# Patient Record
Sex: Male | Born: 1990 | Race: White | Hispanic: No | Marital: Single | Smoking: Current every day smoker
Health system: Southern US, Community
[De-identification: ages and names within clinical notes are randomized; demographics above are authoritative.]

## PROBLEM LIST (undated history)

## (undated) DIAGNOSIS — E079 Disorder of thyroid, unspecified: Secondary | ICD-10-CM

## (undated) DIAGNOSIS — C8590 Non-Hodgkin lymphoma, unspecified, unspecified site: Secondary | ICD-10-CM

## (undated) DIAGNOSIS — F329 Major depressive disorder, single episode, unspecified: Secondary | ICD-10-CM

## (undated) DIAGNOSIS — F1010 Alcohol abuse, uncomplicated: Secondary | ICD-10-CM

## (undated) DIAGNOSIS — F32A Depression, unspecified: Secondary | ICD-10-CM

## (undated) HISTORY — PX: APPENDECTOMY: SHX54

---
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
## 2008-11-08 ENCOUNTER — Emergency Department (HOSPITAL_COMMUNITY): Admission: EM | Admit: 2008-11-08 | Discharge: 2008-11-08 | Payer: Self-pay | Admitting: Emergency Medicine

## 2009-08-13 ENCOUNTER — Emergency Department (HOSPITAL_COMMUNITY): Admission: EM | Admit: 2009-08-13 | Discharge: 2009-08-13 | Payer: Self-pay | Admitting: Emergency Medicine

## 2009-08-15 ENCOUNTER — Emergency Department (HOSPITAL_COMMUNITY): Admission: EM | Admit: 2009-08-15 | Discharge: 2009-08-16 | Payer: Self-pay | Admitting: Emergency Medicine

## 2009-08-16 ENCOUNTER — Inpatient Hospital Stay (HOSPITAL_COMMUNITY): Admission: RE | Admit: 2009-08-16 | Discharge: 2009-08-17 | Payer: Self-pay | Admitting: Psychiatry

## 2009-08-16 ENCOUNTER — Ambulatory Visit: Payer: Self-pay | Admitting: Psychiatry

## 2010-09-04 LAB — BASIC METABOLIC PANEL
BUN: 3 mg/dL — ABNORMAL LOW (ref 6–23)
BUN: 5 mg/dL — ABNORMAL LOW (ref 6–23)
CO2: 25 mEq/L (ref 19–32)
Calcium: 8.9 mg/dL (ref 8.4–10.5)
Calcium: 9.1 mg/dL (ref 8.4–10.5)
Chloride: 104 mEq/L (ref 96–112)
Creatinine, Ser: 0.67 mg/dL (ref 0.4–1.5)
GFR calc Af Amer: >60 mL/min (ref >60)
GFR calc non Af Amer: >60 mL/min (ref >60)
GFR calc non Af Amer: >60 mL/min (ref >60)
Glucose, Bld: 117 mg/dL — ABNORMAL HIGH (ref 70–99)
Glucose, Bld: 98 mg/dL (ref 70–99)
Potassium: 3.7 mEq/L (ref 3.5–5.1)
Potassium: 3.8 mEq/L (ref 3.5–5.1)
Sodium: 145 mEq/L (ref 135–145)

## 2010-09-04 LAB — RAPID URINE DRUG SCREEN, HOSP PERFORMED
Amphetamines: NOT DETECTED
Amphetamines: NOT DETECTED
Barbiturates: NOT DETECTED
Benzodiazepines: POSITIVE — AB
Benzodiazepines: POSITIVE — AB
Cocaine: NOT DETECTED
Cocaine: NOT DETECTED
Opiates: NOT DETECTED
Opiates: NOT DETECTED
Tetrahydrocannabinol: NOT DETECTED
Tetrahydrocannabinol: NOT DETECTED

## 2010-09-04 LAB — CBC
HCT: 47.2 % (ref 39.0–52.0)
HCT: 50.3 % (ref 39.0–52.0)
Hemoglobin: 17.8 g/dL — ABNORMAL HIGH (ref 13.0–17.0)
MCHC: 35.3 g/dL (ref 30.0–36.0)
MCV: 99.7 fL (ref 78.0–100.0)
Platelets: 180 10*3/uL (ref 150–400)
Platelets: 209 10*3/uL (ref 150–400)
RBC: 5.05 MIL/uL (ref 4.22–5.81)
RDW: 14.7 % (ref 11.5–15.5)
RDW: 15.1 % (ref 11.5–15.5)
WBC: 6.3 10*3/uL (ref 4.0–10.5)

## 2010-09-04 LAB — ETHANOL
Alcohol, Ethyl (B): 119 mg/dL — ABNORMAL HIGH (ref 0–10)
Alcohol, Ethyl (B): 244 mg/dL — ABNORMAL HIGH (ref 0–10)

## 2010-09-04 LAB — DIFFERENTIAL
Basophils Absolute: 0 10*3/uL (ref 0.0–0.1)
Basophils Absolute: 0 10*3/uL (ref 0.0–0.1)
Basophils Relative: 0 % (ref 0–1)
Eosinophils Absolute: 0 10*3/uL (ref 0.0–0.7)
Eosinophils Absolute: 0 10*3/uL (ref 0.0–0.7)
Eosinophils Relative: 0 % (ref 0–5)
Eosinophils Relative: 1 % (ref 0–5)
Lymphocytes Relative: 30 % (ref 12–46)
Lymphocytes Relative: 31 % (ref 12–46)
Lymphs Abs: 1.9 10*3/uL (ref 0.7–4.0)
Monocytes Absolute: 0.5 10*3/uL (ref 0.1–1.0)
Monocytes Relative: 7 % (ref 3–12)
Neutro Abs: 3.9 10*3/uL (ref 1.7–7.7)
Neutrophils Relative %: 62 % (ref 43–77)

## 2010-09-04 LAB — TSH: TSH: 4.785 u[IU]/mL — ABNORMAL HIGH (ref 0.350–4.500)

## 2010-09-04 LAB — HEPATIC FUNCTION PANEL
Albumin: 4.4 g/dL (ref 3.5–5.2)
Bilirubin, Direct: 0.3 mg/dL (ref 0.0–0.3)
Indirect Bilirubin: 1.3 mg/dL — ABNORMAL HIGH (ref 0.3–0.9)
Total Bilirubin: 1.6 mg/dL — ABNORMAL HIGH (ref 0.3–1.2)

## 2010-10-24 NOTE — Consult Note (Signed)
NAMETAKSH, HJORT NO.:  [id number]   MEDICAL RECORD NO.:  [id number]          PATIENT TYPE:  EMS   LOCATION:  ED                            FACILITY:  [facility]   PHYSICIAN:  J. Darreld Mclean, M.D. DATE OF BIRTH:  [birth year]   DATE OF CONSULTATION:  DATE OF DISCHARGE:  11/08/2008                                 CONSULTATION   The patient seen today in emergency room with request of the emergency  room staff.   The patient is a 20 year old white male, who last night cut his right  ulnar volar to most ulnarward forearm approximately 2 inches proximal to  the distal ulnar head.  It is a deep laceration.  It is approximately 4-  5 cm long and it is deep.  With Steri-Strips on it, he comes to the ER  today about 12 hours to 13 hours after the injury and wanted to be  checked.  He has no numbness.  He was concerned about the depth of the  wound and whether the Steri-Strips that he applied would be sufficient.  Denies any head injury.  Denies any other injuries.  Other than the  Steri-Strips, he had not done anything.  He did not put any type of  ointment or disinfectant over the wound.   Range of motion in the fingers are full on the right hand.  This is his  right arm.  Ulnar nerve function is intact.  The interosseous functions  are intact by taking a piece of paper and squeezing tightly.  The  interosseous function is normal.  There is no sensory loss.  Motor is  normal.   IMPRESSION:  Deep laceration, right forearm distally ulnar aspect volar  to ulnarward forearm.   Ms. Trisha Mangle, a physician assistant, will cleanse the wound.  She has  already started to do this and it was loosely reapproximated.  We will  place him on Keflex.  He does not want pain medicine and specifically  states rather just have Advil or Tylenol for pain, we will do that.  I  will see him in the office on Wednesday morning.  He is to call for an  appointment, numbers have been given.   Return to the emergency room if  any problem.   Please refer her note and the other nurse's notes in the chart here and  incorporated by reference.  His vitals are normal.           ______________________________  J. Darreld Mclean, M.D.     JWK/MEDQ  D:  11/08/2008  T:  11/09/2008  Job:  [id number]

## 2010-10-24 NOTE — H&P (Signed)
[name], [name] NO.:  [id number]   MEDICAL RECORD NO.:  [id number]          PATIENT TYPE:  IPS   LOCATION:  0301                          FACILITY:  [facility]   PHYSICIAN:  Jasmine Pang, M.D. DATE OF BIRTH:  [birth year]   DATE OF ADMISSION:  10/01/2007  DATE OF DISCHARGE:                       PSYCHIATRIC ADMISSION ASSESSMENT   HISTORY OF PRESENT ILLNESS:  The patient is here on petition.  Papers  state the patient was threatening to harm himself, feeling depressed.  He was assessed at [facility] for an overdose of Xanax and  alcohol.  The patient does today states that he was drinking some  because he has not been sleeping well.  He was up for least 24 hours.  Reporting his mind has been racing.  He was drinking [employer] and took  1 Xanax and found himself in the emergency room.  He states that he  drinks two to three times a week, drinks beer on the weekend, noted to  be at approximately 12-pack.  He denies any ongoing use of Xanax and  admits to buying them off the street.  He does not feel that he needs to  be here.  He denies that he was trying to harm himself.  He does not  feel depressed or suicidal.  He states he has been sleeping okay and is  scheduled to go to his family doctor to get some sleep aids.   PAST PSYCHIATRIC HISTORY:  First admission to [facility].  He does not currently see a psychiatrist or a Veterinary surgeon.   SOCIAL HISTORY:  An 20 year old male who lives his parents and a 13-year-  old sister.  Reports a good relationship with them and has a 10th grade  education.   FAMILY HISTORY:  Grandfather with alcohol problems .  The patient  smokes.  He reports his first drink was at age of 68, drinking Smirnov.  Denies any other drug use other than the recent use of Xanax.   PRIMARY CARE PHYSICIAN:  Dr. Wilnette Kales.   MEDICAL PROBLEMS:  1. A history of non-Hodgkin's lymphoma.  He reports __________ .  2. Hypothyroidism.   MEDICATIONS:  Has been on Synthroid 50 mcg.   DRUG ALLERGIES:  Reports stomach upset with HYDROCODONE.   PHYSICAL EXAMINATION:  GENERAL:  A young male, appears younger than his  calendar years, is in no acute distress.  He denies any complaints.  VITAL SIGNS:  Temperature 98.1, 69 heart rate, 16 respirations, blood  pressure is 109/70, 5 feet 8 inches tall, 115 pounds.  100% saturated.  His physical exam was reviewed at [facility].  No significant  findings.  He had poor eye contact.  Nursing skin  assessment showed he  had a scar from a Port-A-Cath.   LABORATORY DATA:  Urine drug screen is positive for benzodiazepines.  Sodium was 146.  Urinalysis is negative.  Alcohol level of 181 done on  January 24.  WBC is 5.  Liver enzymes are within normal limits.  Hemoglobin 13.1, hematocrit 37.   MENTAL STATUS EXAM:  This is a fully alert, cooperative male,  casually  dressed.  Speech is soft-spoken, polite.  Mood is neutral.  The  patient's affect is pleasant, flat.  Thought processes  are coherent.  No evidence of psychosis.  Cognitive function intact.  His memory is  good.  Judgment and insight is poor.   AXIS I:  Alcohol abuse, benzodiazepines abuse, rule out depressive  disorder NOS.  AXIS II:  Deferred.  AXIS III:  Hypothyroidism.  AXIS IV:  Education, psychosocial problems.  AXIS V:  Current is 45-50.   PLAN:  Plan is to have Librium available for withdrawal symptoms.  We  will have some trazodone available for sleep.  Will address underage  drinking.  Will have a family session with parents as soon as possible.  Case manager will assess follow-up.  Tentative length of stay is 2-3  days      Landry Corporal, N.P.      Jasmine Pang, M.D.  Electronically Signed    JO/MEDQ  D:  10/01/2007  T:  10/01/2007  Job:  [id number]

## 2010-10-27 NOTE — Discharge Summary (Signed)
[name], COLLEGE NO.:  [id number]   MEDICAL RECORD NO.:  [id number]          PATIENT TYPE:  IPS   LOCATION:  0301                          FACILITY[facility]  [facility]   PHYSICIAN:  Jasmine Pang, M.D. DATE OF BIRTH:  [birth year]   DATE OF ADMISSION:  10/01/2007  DATE OF DISCHARGE:  10/02/2007                               DISCHARGE SUMMARY   IDENTIFICATION:  This is an 20 year old male who was here on involuntary  commitment on October 01, 2007.   HISTORY OF PRESENT ILLNESS:  Papers stated that the patient was  threatening to harm himself feeling depressed.  He was assessed at  [facility] for an overdose of Xanax and alcohol.  The patient  does today state that he was drinking some, because he had not been  sleeping well.  He was up for at least 24 hours.  He reported that his  mind was racing.  He was drinking [employer] and took 1 Xanax and found  himself in the emergency room.  He states that he drinks beer on the  weekends and is noted to be at approximately 12 packs per day.  He  denies any ongoing use of Xanax and admits to buying them off the  street.  He does not feel he needs to be here.  He denies he was trying  to harm himself.  He does not feel depressed or suicidal.  He states  that he has been sleeping okay.   PSYCHIATRIC HISTORY:  This is the first admission to [facility].  He does not currently see a psychiatrist or counselor.   FAMILY HISTORY:  Grandfather had alcohol problems.   SUBSTANCE ABUSE HISTORY:  The patient smokes.  He reports his first  drink was at age 62 drinking Smirnoff.  He denies any other drug use  other than the recent use of Xanax.   MEDICAL PROBLEMS:  A history of non-Hodgkin lymphoma, also  hypothyroidism.   MEDICATIONS:  The patient has been on Synthroid 50 mcg daily.   DRUG ALLERGIES:  He reports stomach upset with HYDROCODONE.   PHYSICAL EXAM:  The patient presented as a young male who appeared  younger than his calendar years.  He had no acute medical or physical  problems.  He was evaluated at the ED prior to admission   LABORATORY DATA:  Urine drug screen was positive for benzodiazepine.  Sodium is 146.  Urinalysis is negative.  Alcohol level 181 done on  January 24 WBC is 5.  Liver enzymes are within normal limits.  Hemoglobin 13.1, hematocrit 37.   HOSPITAL COURSE:  Upon admission, the patient was continued on his  Synthroid 50 mcg p.o. daily.  He was also started on Librium 25 mg p.o.  q.6 h. p.r.n. symptoms of withdrawal and trazodone 50 mg p.o. q.h.s.  p.r.n.  He was also started on a nicotine patch 21 mg as per smoking  cessation protocol.  The patient tolerated these medications well with  no significant side effects.  In individual sessions with me, the  patient was reserved, but cooperative.  He denied he tried to kill  himself.  He stated he drinks beer on the weekends, but does not usually  get drunk.  He stated he had a Hodgkin lymphoma but cured now.  The  patient began to want discharge.  He stated he could go home and live  with his mother.  His mother was called and confirmed that this was  true.  She on October 02, 2007, the patient's mood was less depressed and  less anxious.  Affect was consistent with mood.  There was no suicidal  or homicidal ideation.  No thoughts of self-injurious behavior.  No  auditory or visual hallucinations.  No paranoia or delusions.  Thoughts  were logical and goal-directed.  Thought content, no predominant theme.  Cognitive was grossly back to baseline.  The patient felt ready to go  home and was going to be discharged after the family session.  There was  a family session with his mother.  The patient admitted he needs to stop  using alcohol.  Mother states she has banned alcohol from the house, but  the patient has knocked it in.  Mother is supportive of him trying to  quit.  The patient states he does not want medication or  counseling,  because he does not like pills and does not like talking about his  feelings.  Mother felt the patient would be better off if he had  something to do, so she is going to get him back in GED classes or get  him a job.  The patient denied suicidal ideation.  Mother denied any  concerns for his safety.   DISCHARGE DIAGNOSES:  Axis I:  Alcohol abuse or polysubstance abuse.  Depressive disorder, nothing otherwise specified.  Axis II:  None.  Axis III:  Hypothyroidism.  Axis IV:  Severe (problems with psychosocial issues, problems with  education, problems with finances, and burden to chemical dependence  illness and psychiatric illness).   Axis V:  Global assessment of functioning at discharge was 50.  GAF upon  admission was 45.  GAF highest past year was 60-65.   DISCHARGE PLAN:  There was no significant activity level or dietary  restrictions.   POSTHOSPITAL CARE PLANS:  The patient will go to the [facility] on April 29th at 10 a.m.   DISCHARGE MEDICATIONS:  Levothyroxine 10 mcg daily.      Jasmine Pang, M.D.  Electronically Signed     BHS/MEDQ  D:  11/02/2007  T:  11/03/2007  Job:  [id number]

## 2011-04-02 ENCOUNTER — Emergency Department (HOSPITAL_COMMUNITY)
Admission: EM | Admit: 2011-04-02 | Discharge: 2011-04-02 | Disposition: A | Discharge status: Other Acute Inpatient Hospital | Payer: Self-pay | Attending: Emergency Medicine | Admitting: Emergency Medicine

## 2011-04-02 ENCOUNTER — Encounter: Payer: Self-pay | Admitting: *Deleted

## 2011-04-02 ENCOUNTER — Inpatient Hospital Stay (HOSPITAL_COMMUNITY)
Admission: AD | Admit: 2011-04-02 | Discharge: 2011-04-06 | DRG: 897 | Disposition: A | Discharge status: Home / Self Care | Payer: 59 | Attending: Psychiatry | Admitting: Psychiatry

## 2011-04-02 DIAGNOSIS — F1020 Alcohol dependence, uncomplicated: Principal | ICD-10-CM

## 2011-04-02 DIAGNOSIS — Z87898 Personal history of other specified conditions: Secondary | ICD-10-CM

## 2011-04-02 DIAGNOSIS — F17200 Nicotine dependence, unspecified, uncomplicated: Secondary | ICD-10-CM | POA: Insufficient documentation

## 2011-04-02 DIAGNOSIS — E039 Hypothyroidism, unspecified: Secondary | ICD-10-CM

## 2011-04-02 DIAGNOSIS — R45851 Suicidal ideations: Secondary | ICD-10-CM

## 2011-04-02 DIAGNOSIS — Z79899 Other long term (current) drug therapy: Secondary | ICD-10-CM

## 2011-04-02 DIAGNOSIS — F3289 Other specified depressive episodes: Secondary | ICD-10-CM

## 2011-04-02 DIAGNOSIS — G4700 Insomnia, unspecified: Secondary | ICD-10-CM

## 2011-04-02 DIAGNOSIS — F329 Major depressive disorder, single episode, unspecified: Secondary | ICD-10-CM

## 2011-04-02 DIAGNOSIS — F1010 Alcohol abuse, uncomplicated: Secondary | ICD-10-CM

## 2011-04-02 DIAGNOSIS — F1994 Other psychoactive substance use, unspecified with psychoactive substance-induced mood disorder: Secondary | ICD-10-CM

## 2011-04-02 DIAGNOSIS — E079 Disorder of thyroid, unspecified: Secondary | ICD-10-CM | POA: Insufficient documentation

## 2011-04-02 HISTORY — DX: Depression, unspecified: F32.A

## 2011-04-02 HISTORY — DX: Major depressive disorder, single episode, unspecified: F32.9

## 2011-04-02 HISTORY — DX: Non-Hodgkin lymphoma, unspecified, unspecified site: C85.90

## 2011-04-02 HISTORY — DX: Disorder of thyroid, unspecified: E07.9

## 2011-04-02 LAB — ETHANOL: Alcohol, Ethyl (B): 148 mg/dL — ABNORMAL HIGH (ref 0–11)

## 2011-04-02 LAB — URINALYSIS, ROUTINE W REFLEX MICROSCOPIC
Ketones, ur: NEGATIVE mg/dL
Leukocytes, UA: NEGATIVE
Nitrite: NEGATIVE
Protein, ur: NEGATIVE mg/dL
Urobilinogen, UA: 0.2 mg/dL (ref 0.0–1.0)

## 2011-04-02 LAB — URINE MICROSCOPIC-ADD ON

## 2011-04-02 NOTE — ED Provider Notes (Signed)
History     CSN: [id number] Arrival date & time: 04/02/2011  9:20 AM   First MD Initiated Contact with Patient 04/02/11 [phone]      Chief Complaint  Patient presents with  . Medical Clearance    (Consider location/radiation/quality/duration/timing/severity/associated sxs/prior treatment) HPI Comments: Patient is brought in by [facility] at mothers request as he was found with lacerations on his scalp this morning which were self inflicted, as a straight edge blade was found in his home, with fresh blood on it.  Patient however,  Denies any thoughts or attempt to harm self.  He states he was intoxicated last night,  And tripped,  Hitting his head on a coffee table, breaking the glass.   States "if I wanted to hurt myself, I would have slit my wrists,  Not my forehead".    Apparently there were also pictures found at his home (and on facebook?) with blood on his face,  Holding a hatchet to his throat.  When asked about this, patient states this was a staged picture for Halloween,  The blood was fake,  And again he has no suicidal intent.  He also denies depression,  But states has been upset over his parents separation.     Per the sheriff at bedside,  There was no damage to the coffee table at the patients home when he was picked up.  All that was found was the bloody blade and lots of blood on patients clothing and immediate surroundings.  Patient is a 20 y.o. male presenting with mental health disorder. The history is provided by the patient.  Mental Health Problem The primary symptoms do not include dysphoric mood or hallucinations.  Additional symptoms of the illness do not include no headaches or no abdominal pain.    Past Medical History  Diagnosis Date  . Lymphoma   . Depression   . Thyroid disease     Past Surgical History  Procedure Date  . Appendectomy     History reviewed. No pertinent family history.  History  Substance Use Topics  . Smoking  status: Current Everyday Smoker  . Smokeless tobacco: Not on file  . Alcohol Use: Yes      Review of Systems  Constitutional: Negative for fever.  HENT: Negative for congestion, sore throat and neck pain.   Eyes: Negative.   Respiratory: Negative for chest tightness and shortness of breath.   Cardiovascular: Negative for chest pain.  Gastrointestinal: Negative for nausea and abdominal pain.  Genitourinary: Negative.   Musculoskeletal: Negative for joint swelling and arthralgias.  Skin: Negative.  Negative for rash and wound.  Neurological: Negative for dizziness, weakness, light-headedness, numbness and headaches.  Hematological: Negative.   Psychiatric/Behavioral: Negative.  Negative for suicidal ideas, hallucinations, confusion, self-injury and dysphoric mood. The patient is not nervous/anxious.     Allergies  Morphine and related  Home Medications   Current Outpatient Rx  Name Route Sig Dispense Refill  . ACETAMINOPHEN 325 MG PO TABS Oral Take 650 mg by mouth every 6 (six) hours as needed. For pain     . LEVOTHYROXINE SODIUM 75 MCG PO TABS Oral Take 75 mcg by mouth daily.        BP 137/87  Pulse 108  Temp(Src) 98.2 F (36.8 C) (Oral)  Resp 16  Ht 5\' 6"  (1.676 m)  Wt 120 lb (54.432 kg)  BMI 19.37 kg/m2  SpO2 100%  Physical Exam  Nursing note and vitals reviewed. Constitutional: He is oriented  to person, place, and time. He appears well-developed and well-nourished. No distress.       Smells of etoh,  But answers questions appropriately,  Does not appear intoxicated.  HENT:  Head: Normocephalic and atraumatic.  Eyes: Conjunctivae are normal.  Neck: Normal range of motion.  Cardiovascular: Normal rate, regular rhythm, normal heart sounds and intact distal pulses.   Pulmonary/Chest: Effort normal and breath sounds normal. He has no wheezes.  Abdominal: Soft. Bowel sounds are normal. There is no tenderness.  Musculoskeletal: Normal range of motion.  Neurological:  He is alert and oriented to person, place, and time.  Skin: Skin is warm and dry.  Psychiatric: He has a normal mood and affect.    ED Course  Procedures (including critical care time)  Labs Reviewed  URINALYSIS, ROUTINE W REFLEX MICROSCOPIC - Abnormal; Notable for the following:    Hgb urine dipstick TRACE (*)    All other components within normal limits  URINE RAPID DRUG SCREEN (HOSP PERFORMED) - Abnormal; Notable for the following:    Benzodiazepines POSITIVE (*)    All other components within normal limits  ETHANOL - Abnormal; Notable for the following:    Alcohol, Ethyl (B) 148 (*)    All other components within normal limits  URINE MICROSCOPIC-ADD ON   No results found.   No diagnosis found.   Originally planned for telepsych consult to sort out patients suicidality given conflicting information.  However,  Mother presented to ed,  And additional information given,  Including that patient stated to her yesterday that if he had a gun,  He would kill himself.  She also expresses concern over his etoh abuse.  She states he drinks constantly when he is not passed out from drinking.  Call to Frances Maywood with ACT team who will come and get him placed for inpatient psych tx and detox.    MDM  Suicidal ideation and gesture. Etoh abuse.        Candis Musa, PA 04/02/11 1737

## 2011-04-02 NOTE — ED Notes (Signed)
Mother sent home and would like for Tommy with ACT to call her concerning pt

## 2011-04-02 NOTE — ED Notes (Signed)
Mother Challen Spainhour cell [phone]

## 2011-04-02 NOTE — ED Notes (Signed)
Patient with blister noted to right elbow, patient wanted it opened, instructed pt not to pop the blister, will monitor

## 2011-04-02 NOTE — ED Notes (Signed)
Pt was found in a room with superficial cuts to his head and clothing soaked in blood. Mother states pt stated he wanted to harm himself yesterday. Deputy also states pictures were taken with pt holding hatchet up to his throat. Mother states pt was treated for lymphoma at age 20 and "has not been the same since".

## 2011-04-02 NOTE — ED Provider Notes (Signed)
History     CSN: [id number] Arrival date & time: 04/02/2011  9:20 AM   First MD Initiated Contact with Patient 04/02/11 [phone]      Chief Complaint  Patient presents with  . Medical Clearance    (Consider location/radiation/quality/duration/timing/severity/associated sxs/prior treatment) HPI:  Level 5 caveat;  Urgent need for intervention.  Pt found in room c cuts to head and bloody clothes.  ? Hatchet was held to his throat.  Has s ideation  Past Medical History  Diagnosis Date  . Lymphoma   . Depression   . Thyroid disease     Past Surgical History  Procedure Date  . Appendectomy     History reviewed. No pertinent family history.  History  Substance Use Topics  . Smoking status: Current Everyday Smoker  . Smokeless tobacco: Not on file  . Alcohol Use: Yes      Review of Systems  Unable to perform ROS: Psychiatric disorder    Allergies  Morphine and related  Home Medications   Current Outpatient Rx  Name Route Sig Dispense Refill  . ACETAMINOPHEN 325 MG PO TABS Oral Take 650 mg by mouth every 6 (six) hours as needed. For pain     . LEVOTHYROXINE SODIUM 75 MCG PO TABS Oral Take 75 mcg by mouth daily.        BP 141/89  Pulse 88  Temp(Src) 97.3 F (36.3 C) (Oral)  Resp 16  Ht 5\' 6"  (1.676 m)  Wt 120 lb (54.432 kg)  BMI 19.37 kg/m2  SpO2 94%  Physical Exam  Nursing note and vitals reviewed. Constitutional: He is oriented to person, place, and time. He appears well-developed and well-nourished.  HENT:  Head: Normocephalic and atraumatic.  Eyes: Conjunctivae and EOM are normal. Pupils are equal, round, and reactive to light.  Neck: Normal range of motion. Neck supple.  Cardiovascular: Normal rate and regular rhythm.   Pulmonary/Chest: Effort normal and breath sounds normal.  Abdominal: Soft. Bowel sounds are normal.  Musculoskeletal: Normal range of motion.  Neurological: He is alert and oriented to person, place, and time.  Skin: Skin is warm and  dry.  Psychiatric:       Flat affect; S ideation    ED Course  Procedures (including critical care time)  Labs Reviewed  URINALYSIS, ROUTINE W REFLEX MICROSCOPIC - Abnormal; Notable for the following:    Hgb urine dipstick TRACE (*)    All other components within normal limits  URINE RAPID DRUG SCREEN (HOSP PERFORMED) - Abnormal; Notable for the following:    Benzodiazepines POSITIVE (*)    All other components within normal limits  ETHANOL - Abnormal; Notable for the following:    Alcohol, Ethyl (B) 148 (*)    All other components within normal limits  URINE MICROSCOPIC-ADD ON   No results found.   1. Suicidal ideation   2. Alcohol abuse       MDM  Obvious psychiatric issues;  Consult [facility]; likely admit        Donnetta Hutching, MD 04/20/11 [phone]

## 2011-04-03 DIAGNOSIS — F332 Major depressive disorder, recurrent severe without psychotic features: Secondary | ICD-10-CM

## 2011-04-04 LAB — COMPREHENSIVE METABOLIC PANEL
ALT: 43 U/L (ref 0–53)
AST: 34 U/L (ref 0–37)
Alkaline Phosphatase: 76 U/L (ref 39–117)
CO2: 29 mEq/L (ref 19–32)
GFR calc Af Amer: >90 mL/min (ref >90)
GFR calc non Af Amer: >90 mL/min (ref >90)
Glucose, Bld: 83 mg/dL (ref 70–99)
Potassium: 4.2 mEq/L (ref 3.5–5.1)
Sodium: 138 mEq/L (ref 135–145)
Total Protein: 7 g/dL (ref 6.0–8.3)

## 2011-04-04 LAB — CBC
Hemoglobin: 13.9 g/dL (ref 13.0–17.0)
Platelets: 174 10*3/uL (ref 150–400)
RBC: 4.24 MIL/uL (ref 4.22–5.81)

## 2011-04-05 NOTE — Assessment & Plan Note (Signed)
[name], [name] NO.:  [id number]  MEDICAL RECORD NO.:  [id number]  LOCATION:  0307                          FACILITY:  [facility]  PHYSICIAN:  Orson Aloe, MD       DATE OF BIRTH:  [birth year]  DATE OF ADMISSION:  04/02/2011 DATE OF DISCHARGE:                      PSYCHIATRIC ADMISSION ASSESSMENT   This is a 20 year old Caucasian male from [place], [place] involuntarily admitted to Dr. Tilman Neat service.  REASON FOR ADMISSION:  The patient has a history of alcohol-related legal problems and he says that he has not had problems with his excessive alcohol use since September of last year.  He drank 3-6 beers on Saturday night, the 20th, and he took a misstep coming down some stairs, fell off a step about 2-1/2 feet off the ground, landed with his head hitting the corner of a glass table and broke the glass and cut his forehead in 3 places in 3 different directions.  The family took out commitment papers on him.  Basically the commitment papers read:  He told me he wanted help Sunday, the 21st.  He said he had so much anger in him that he wanted to die or hurt somebody.  He said that if he could get a gun, he would kill himself or find pills to overdose.  He is a very lonely person.  He drinks by himself until he passes out and then he will sleep with beer under him so no one takes it.  When he hits the floor the next day, he is drinking again.  I really do think he would hurt himself or someone else.  He did use a razor blade tonight when he cut his head.  He is a very kind-hearted person sober.  His loneliness is the reason for his drinking and drinking is the reason for what he is doing now.  He needs help.  The commitment papers read: The respondent has told his mother that if he had a gun, he would shoot himself or he would overdose.  He also told her he would lie about everything if she tried to commit him.  He had been drinking excessively.  He cannot  contract for safety.  And so he is brought here for evaluation.  PAST PSYCHIATRIC HISTORY:  Admits to one DUI and one 90-day jail sentence related to breaking and entering at [facility] because of his alcohol use.  SOCIAL HISTORY:  He has a GED acquired while incarcerated at [facility].  He never married and has no kids.  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  FAMILY HISTORY:  His father drank as a teenager but quit when the patient was born.  The patient was not aware of any other family members having substance abuse or mental illness issues.  ALCOHOL-RELATED HISTORY:  He began using caffeine at age 89, nicotine at age 32, alcohol at age 67, THC only 2 or 3 times at age 23.  He used Xanax to go to sleep with in his 80s.  He said he "did not like the effect of that."  He has never used any IV drugs or inhalants.  MEDICAL HISTORY:  He has no primary care provider.  He has a history of Hodgkin's lymphoma, low Synthroid on Synthroid replacement but refuses to take it because it "does not feel right."  He is status post appendectomy.  MEDICATIONS:  Synthroid which he refuses to take.  ALLERGIES:  Morphine which makes him itch real bad.  POSITIVE FINDINGS:  His urine drug screen was positive for benzos.  His blood alcohol level was 148.  His urinalysis had trace hemoglobin.  No other labs were done.  PHYSICAL EXAMINATION:  VITAL SIGNS:  His blood pressure was 141/89; his pulse was 88; temperature is 97.3. MENTAL STATUS:  He denies any suicidal or homicidal ideation at this time.  He has good eye contact.  He is able to focus adequately in group and one-to-one settings.  Argues profusely that he really does not have an alcohol problem.  He needs to go home because his family needs him there.  He has clear goal-directed thoughts of not admitting to any kind of alcohol problem at this  time.  He has natural conversational speech, volume, rate and tone.  He is oriented times 4.  Recent and remote memory intact.  His judgment is extremely limited.  When it comes to his assessment of his substance abuse problem, insight is limited. COGNITIVE:  Average to below average intelligence.  DIAGNOSES:  AXIS I:  Alcohol dependence and substance-induced mood disorder. AXIS II:  Deferred. AXIS III:  Hypothyroidism status post Hodgkin's lymphoma. Status post appendectomy. AXIS IV:  Moderate with psychosocial problems related to substance use. Current is 35.  PLAN:  Plan is detox, collaborate with family.  Refer to aftercare if appropriate.  Estimated length stay is 3-5 days.          ______________________________ Orson Aloe, MD     EW/MEDQ  D:  04/03/2011  T:  04/03/2011  Job:  [id number]  Electronically Signed by Orson Aloe  on 04/05/2011 12:58:08 PM

## 2011-04-06 NOTE — ED Provider Notes (Signed)
Medical screening examination/treatment/procedure(s) were performed by non-physician practitioner and as supervising physician I was immediately available for consultation/collaboration.   Layliana Devins L Amerie Beaumont, MD 04/06/11 0902 

## 2011-04-10 NOTE — Discharge Summary (Signed)
[name], MORRICAL NO.:  [id number]  MEDICAL RECORD NO.:  [id number]  LOCATION:  0307                          FACILITY:  [facility]  PHYSICIAN:  Orson Aloe, MD       DATE OF BIRTH:  [birth year]  DATE OF ADMISSION:  04/02/2011 DATE OF DISCHARGE:  04/06/2011                              DISCHARGE SUMMARY   This 20 year old Caucasian male from [place], [place], is involuntarily committed.  He has a history of alcohol-related legal problems.  He says he has not had problems with his extensive alcohol use since September of last year.  He drank 3-6 beers on Saturday night October 20.  He took a misstep coming down some stairs, fell off a step about 2-1/2 feet off the ground, landed with his head hitting the corner of a glass table and broke off a 6 inch lead off the edge of a glass table and cut his forehead in 3 places in 3 different directions.  The family took out commitment papers.  The commitment papers read "he told me he wanted help Sunday October 21; he said he had so much angry in him that he wanted to die or hurt somebody; he said if he could get a gun he would kill himself or find pills to overdose; he is very lonely person; he drinks by himself until he passes out and then he will sleep with beer under him so no one takes it; when he hits the floor the next day he is drinking again; I really do think he would hurt himself or someone else; he did use razor blade tonight to cut his head; he is a very kind hearted person sober; his loneliness is the reason for his drinking and his drinking is the reason for what he is doing now; he needs help." that was a statement from his mother.  Respondent had told his mother that if he had a gun he would shoot himself.  He also coached his mother to recant all of that.  POSITIVE FINDINGS:  Urine drug screen was positive for benzos.  His alcohol blood level was 148.  Urinalysis had trace hemoglobin and no other labs  were done.  DIAGNOSES:  AXIS I:  Alcohol dependence with substance induced mood disorder. AXIS II:  Deferred. AXIS III:  Hypothyroidism, status post Hodgkin's lymphoma, status post appendectomy. AXIS IV:  Moderate with psychosocial problems related to substance abuse. AXIS V:  35.  The plan was for detox, collaborate with family, referred aftercare if appropriate.  He was admitted and started on Librium detox, given trazodone 50 mg at bedtime and Synthroid 50 mcg a day.  He was given no loading dose. Thyroid profile was ordered.  He needed medical followup for his thyroid.  He as also given a nicotine patch.  He was allowed to sign in voluntarily.  CBC and a CMET were also ordered.  Robitussin and Claritin were ordered for his allergies and seemed to be helpful for him.  CONDITION ON DISCHARGE:  The patient denied any hallucinations, illusions, delusions, suicidal intent or homicidal intent.  He flatly denied any suicidal ideation.  He swore he had never been suicidal.  He only had just fallen and taken misstep and hurt his head.  When he was confronted about his actual blading where he takes a razor blade and cuts his forehead and makes it bleed like all get out he states he was doing it for a Halloween picture prank.  He had good eye contact.  Able to focus well enough in one-to-one and group settings.  He had clear goal-directed thoughts and natural conversational speech, volume, rate and tone.  He is oriented times 4.  Recent and memory was intact. Judgment is not quite appropriate; he does not quite appreciate the disease concept of addiction.  His insight seems to be quite limited.  DISCHARGE DIAGNOSES:  AXIS I:  Alcohol dependence. AXIS II:  Deferred. AXIS III:  History of Hodgkin's lymphoma, hypothyroidism and status post appendectomy. AXIS IV:  Moderate, psychosocial issues related to substance use.  His GAF was 50. AXIS V:  50.  He is not on multiple antipsychotics  at time of discharge.  RECOMMENDATION:  Was to follow through with his taking his Synthroid and his allergy medicines and his sleeping medicines that specifically were levothyroxine 50 mcg a day, 10 mg of loratadine for allergies a day and trazodone 50 mg at bedtime for insomnia a day.  He was committed on outpatient commitment to follow up at [facility] at [phone] and had an appointment on October 30 at 8:00 a.m.  He was to ask for IOP and he was also to follow up at [facility] in [place] for his thyroid condition.  Further comments was he was to do 90 meetings in 90 days, get obsessed with his recovery, get a trusted sponsor and work steps honestly, call back for results of thyroid tests which are supposed to be available later.          ______________________________ Orson Aloe, MD     EW/MEDQ  D:  04/10/2011  T:  04/10/2011  Job:  [id number]  Electronically Signed by Orson Aloe  on 04/10/2011 01:48:35 PM

## 2011-11-11 ENCOUNTER — Emergency Department (HOSPITAL_COMMUNITY)
Admission: EM | Admit: 2011-11-11 | Discharge: 2011-11-11 | Disposition: A | Discharge status: Home / Self Care | Payer: Self-pay | Attending: Emergency Medicine | Admitting: Emergency Medicine

## 2011-11-11 ENCOUNTER — Encounter (HOSPITAL_COMMUNITY): Payer: Self-pay

## 2011-11-11 DIAGNOSIS — E079 Disorder of thyroid, unspecified: Secondary | ICD-10-CM | POA: Insufficient documentation

## 2011-11-11 DIAGNOSIS — F1010 Alcohol abuse, uncomplicated: Secondary | ICD-10-CM | POA: Insufficient documentation

## 2011-11-11 DIAGNOSIS — F10929 Alcohol use, unspecified with intoxication, unspecified: Secondary | ICD-10-CM

## 2011-11-11 DIAGNOSIS — F3289 Other specified depressive episodes: Secondary | ICD-10-CM | POA: Insufficient documentation

## 2011-11-11 DIAGNOSIS — F329 Major depressive disorder, single episode, unspecified: Secondary | ICD-10-CM | POA: Insufficient documentation

## 2011-11-11 HISTORY — DX: Alcohol abuse, uncomplicated: F10.10

## 2011-11-11 LAB — CBC
HCT: 42.4 % (ref 39.0–52.0)
Hemoglobin: 15.3 g/dL (ref 13.0–17.0)
MCH: 34.5 pg — ABNORMAL HIGH (ref 26.0–34.0)
RBC: 4.43 MIL/uL (ref 4.22–5.81)

## 2011-11-11 LAB — RAPID URINE DRUG SCREEN, HOSP PERFORMED
Cocaine: NOT DETECTED
Opiates: NOT DETECTED
Tetrahydrocannabinol: NOT DETECTED

## 2011-11-11 LAB — BASIC METABOLIC PANEL
BUN: 5 mg/dL — ABNORMAL LOW (ref 6–23)
CO2: 22 mEq/L (ref 19–32)
Calcium: 8.8 mg/dL (ref 8.4–10.5)
Glucose, Bld: 158 mg/dL — ABNORMAL HIGH (ref 70–99)
Potassium: 3.5 mEq/L (ref 3.5–5.1)
Sodium: 138 mEq/L (ref 135–145)

## 2011-11-11 LAB — SALICYLATE LEVEL: Salicylate Lvl: <2 mg/dL — ABNORMAL LOW (ref 2.8–20.0)

## 2011-11-11 LAB — DIFFERENTIAL
Eosinophils Absolute: 0.1 10*3/uL (ref 0.0–0.7)
Lymphs Abs: 1.5 10*3/uL (ref 0.7–4.0)
Monocytes Absolute: 0.4 10*3/uL (ref 0.1–1.0)
Monocytes Relative: 7 % (ref 3–12)
Neutrophils Relative %: 68 % (ref 43–77)

## 2011-11-11 LAB — URINALYSIS, ROUTINE W REFLEX MICROSCOPIC
Glucose, UA: NEGATIVE mg/dL
Hgb urine dipstick: NEGATIVE
Specific Gravity, Urine: <1.005 — ABNORMAL LOW (ref 1.005–1.030)

## 2011-11-11 LAB — ACETAMINOPHEN LEVEL: Acetaminophen (Tylenol), Serum: <15 ug/mL (ref 10–30)

## 2011-11-11 NOTE — Discharge Instructions (Signed)
You can get treatment for alcohol abuse at [facility].

## 2011-11-11 NOTE — ED Provider Notes (Signed)
History   This chart was scribed for Osvaldo Human, MD by Toya Smothers. The patient was seen in room APA16A/APA16A. Patient's care was started at 1150.  CSN: [id number]  Arrival date & time 11/11/11  1150   First MD Initiated Contact with Patient 11/11/11 1401      Chief Complaint  Patient presents with  . V70.1   HPI  [name] is a 21 y.o. male presents to the Emergency Department complaining of no illness. Pt states that his mother committed him this morning stating that he was suicidal, and that she was upset with his drinking habits, further stating that he non-suicidal. Pt list current home medications as Tylenol and Synthroid Levothroid and has a h/o lymphoma, depression, thyroid disease, and alcohol abuse. Pt is a  current everyday smoker admitting to the use of alcohol, denying the use of illicit drugs.  Pt list Cancer treatment [facility]   Past Medical History  Diagnosis Date  . Lymphoma   . Depression   . Thyroid disease   . Alcohol abuse    Past Surgical History  Procedure Date  . Appendectomy    History reviewed. No pertinent family history.  History  Substance Use Topics  . Smoking status: Current Everyday Smoker  . Smokeless tobacco: Not on file  . Alcohol Use: Yes    Review of Systems  Constitutional: Negative for fever and chills.  HENT: Negative for rhinorrhea and neck pain.   Eyes: Negative for pain.  Respiratory: Negative for cough and shortness of breath.   Cardiovascular: Negative for chest pain.  Gastrointestinal: Negative for nausea, vomiting, abdominal pain and diarrhea.  Genitourinary: Negative for dysuria.  Musculoskeletal: Negative for back pain.  Skin: Negative for rash.  Neurological: Negative for dizziness and weakness.  A complete 10 system review of systems was obtained and all systems are negative except as noted in the HPI and PMH.   Allergies  Morphine and related  Home Medications   Current Outpatient Rx  Name  Route Sig Dispense Refill  . ACETAMINOPHEN 325 MG PO TABS Oral Take 650 mg by mouth every 6 (six) hours as needed. For pain     . LEVOTHYROXINE SODIUM 75 MCG PO TABS Oral Take 75 mcg by mouth daily.       BP 139/80  Pulse 125  Temp(Src) 98.5 F (36.9 C) (Oral)  Resp 19  SpO2 95%  Physical Exam  Nursing note and vitals reviewed. Constitutional: He is oriented to person, place, and time. He appears well-developed and well-nourished. No distress.  HENT:  Head: Normocephalic and atraumatic.  Eyes: EOM are normal. Pupils are equal, round, and reactive to light.  Neck: Neck supple. No tracheal deviation present.  Cardiovascular: Normal rate.   Pulmonary/Chest: Effort normal. No respiratory distress.  Abdominal: Soft. He exhibits no distension.  Musculoskeletal: Normal range of motion. He exhibits no edema.  Neurological: He is alert and oriented to person, place, and time. No sensory deficit.  Skin: Skin is warm and dry.  Psychiatric: He has a normal mood and affect. His behavior is normal.    ED Course  Procedures (including critical care time)  DIAGNOSTIC STUDIES: Oxygen Saturation is 95% on room air, normal by my interpretation.    COORDINATION OF CARE: 14:10- Evaluated state of Pt's present illness  Labs Reviewed  CBC - Abnormal; Notable for the following:    MCH 34.5 (*)    MCHC 36.1 (*)    All other components within normal limits  ETHANOL - Abnormal; Notable for the following:    Alcohol, Ethyl (B) 309 (*)    All other components within normal limits  BASIC METABOLIC PANEL - Abnormal; Notable for the following:    Glucose, Bld 158 (*)    BUN 5 (*)    All other components within normal limits  URINALYSIS, ROUTINE W REFLEX MICROSCOPIC - Abnormal; Notable for the following:    Specific Gravity, Urine <1.005 (*)    All other components within normal limits  SALICYLATE LEVEL - Abnormal; Notable for the following:    Salicylate Lvl <2.0 (*)    All other components  within normal limits  URINE RAPID DRUG SCREEN (HOSP PERFORMED)  DIFFERENTIAL  ACETAMINOPHEN LEVEL   3:47 PM Results for orders placed during the hospital encounter of 11/11/11  URINE RAPID DRUG SCREEN (HOSP PERFORMED)      Component Value Range   Opiates NONE DETECTED  NONE DETECTED    Cocaine NONE DETECTED  NONE DETECTED    Benzodiazepines NONE DETECTED  NONE DETECTED    Amphetamines NONE DETECTED  NONE DETECTED    Tetrahydrocannabinol NONE DETECTED  NONE DETECTED    Barbiturates NONE DETECTED  NONE DETECTED   CBC      Component Value Range   WBC 6.4  4.0 - 10.5 (K/uL)   RBC 4.43  4.22 - 5.81 (MIL/uL)   Hemoglobin 15.3  13.0 - 17.0 (g/dL)   HCT 40.9  81.1 - 91.4 (%)   MCV 95.7  78.0 - 100.0 (fL)   MCH 34.5 (*) 26.0 - 34.0 (pg)   MCHC 36.1 (*) 30.0 - 36.0 (g/dL)   RDW 78.2  95.6 - 21.3 (%)   Platelets 181  150 - 400 (K/uL)  DIFFERENTIAL      Component Value Range   Neutrophils Relative 68  43 - 77 (%)   Neutro Abs 4.4  1.7 - 7.7 (K/uL)   Lymphocytes Relative 23  12 - 46 (%)   Lymphs Abs 1.5  0.7 - 4.0 (K/uL)   Monocytes Relative 7  3 - 12 (%)   Monocytes Absolute 0.4  0.1 - 1.0 (K/uL)   Eosinophils Relative 2  0 - 5 (%)   Eosinophils Absolute 0.1  0.0 - 0.7 (K/uL)   Basophils Relative 1  0 - 1 (%)   Basophils Absolute 0.0  0.0 - 0.1 (K/uL)  ETHANOL      Component Value Range   Alcohol, Ethyl (B) 309 (*) 0 - 11 (mg/dL)  BASIC METABOLIC PANEL      Component Value Range   Sodium 138  135 - 145 (mEq/L)   Potassium 3.5  3.5 - 5.1 (mEq/L)   Chloride 98  96 - 112 (mEq/L)   CO2 22  19 - 32 (mEq/L)   Glucose, Bld 158 (*) 70 - 99 (mg/dL)   BUN 5 (*) 6 - 23 (mg/dL)   Creatinine, Ser 0.86  0.50 - 1.35 (mg/dL)   Calcium 8.8  8.4 - 57.8 (mg/dL)   GFR calc non Af Amer >90  >90 (mL/min)   GFR calc Af Amer >90  >90 (mL/min)  URINALYSIS, ROUTINE W REFLEX MICROSCOPIC      Component Value Range   Color, Urine YELLOW  YELLOW    APPearance CLEAR  CLEAR    Specific Gravity, Urine  <1.005 (*) 1.005 - 1.030    pH 6.0  5.0 - 8.0    Glucose, UA NEGATIVE  NEGATIVE (mg/dL)   Hgb urine dipstick NEGATIVE  NEGATIVE    Bilirubin Urine NEGATIVE  NEGATIVE    Ketones, ur NEGATIVE  NEGATIVE (mg/dL)   Protein, ur NEGATIVE  NEGATIVE (mg/dL)   Urobilinogen, UA 0.2  0.0 - 1.0 (mg/dL)   Nitrite NEGATIVE  NEGATIVE    Leukocytes, UA NEGATIVE  NEGATIVE   ACETAMINOPHEN LEVEL      Component Value Range   Acetaminophen (Tylenol), Serum <15.0  10 - 30 (ug/mL)  SALICYLATE LEVEL      Component Value Range   Salicylate Lvl <2.0 (*) 2.8 - 20.0 (mg/dL)   Lab tests showed alcohol intoxicaton.  Pt seen by Hattie Perch, Behavioral Health counselor.  Mrs. Hyacinth Meeker called pt's mother, who told her that she had had patient committed to see if he was bipolar.  No indication for admission to a psychiatric facility.  Released.  Advised to stop drinking.     1. Alcohol intoxication     I personally performed the services described in this documentation, which was scribed in my presence. The recorded information has been reviewed and considered.  Osvaldo Human, M.D.   Carleene Cooper III, MD 11/11/11 [phone]

## 2011-11-11 NOTE — ED Notes (Signed)
Pt states he is unable to provide urine sample. Pt requesting to be catheterized.

## 2011-11-11 NOTE — ED Notes (Signed)
Pt brought to er w/ ivc papers in place, pt stated nothing is wrong, papers stated that pt is a alocoholic and threatened suicide this am

## 2011-11-11 NOTE — BH Assessment (Signed)
Assessment Note   [name] is an 21 y.o. male. PT REPORTED TO THE ER UNDER INVOLUNTARY COMMITMENT. PT DENIES S/I, H/I, AND IS NOT PSYCHOTIC. CALLED THE PETITIONER, PT'S MOTHER, [name] WHO ADMITS TO LYING ON THE PETITION. SHE REPORTS SHE WANTED HER SON TO GET TESTED FOR BIPOLAR AND TO GET HELP FOR DRINKING. PETITION RESCINDED. PT OFFERED DETOX AND REFUSED.       Axis I: Alcohol Abuse Axis II: Deferred Axis III:  Past Medical History  Diagnosis Date  . Lymphoma   . Depression   . Thyroid disease   . Alcohol abuse    Axis IV: problems with primary support group Axis V: 51-60 moderate symptoms  Past Medical History:  Past Medical History  Diagnosis Date  . Lymphoma   . Depression   . Thyroid disease   . Alcohol abuse     Past Surgical History  Procedure Date  . Appendectomy     Family History: History reviewed. No pertinent family history.  Social History:  reports that he has been smoking.  He does not have any smokeless tobacco history on file. He reports that he drinks alcohol. He reports that he does not use illicit drugs.  Additional Social History:     CIWA: CIWA-Ar BP: 135/82 mmHg Pulse Rate: 89  COWS:    Allergies:  Allergies  Allergen Reactions  . Morphine And Related Itching    Home Medications:  (Not in a hospital admission)  OB/GYN Status:  No LMP for male patient.  General Assessment Data Location of Assessment: [facility] ED ACT Assessment: Yes Living Arrangements: Parent (and sister) Can pt return to current living arrangement?: Yes Admission Status: Involuntary Is patient capable of signing voluntary admission?: No Transfer from: [facility] Referral Source: MD (dr Ignacia Palma)  Education Status Contact person: [name]  Risk to self Suicidal Ideation: No Suicidal Intent: No Is patient at risk for suicide?: No Suicidal Plan?: No Access to Means: No What has been your use of drugs/alcohol within the  last 12 months?: BEER Previous Attempts/Gestures: No How many times?: 0  Other Self Harm Risks: CUTS TO FOREHEAD TO BLEED DURING [facility] Triggers for Past Attempts: None known Intentional Self Injurious Behavior: Cutting Comment - Self Injurious Behavior: CUTS TO FOREHEAD IN PAST Family Suicide History: No Recent stressful life event(s): Financial Problems Persecutory voices/beliefs?: No Depression: Yes Depression Symptoms: Feeling angry/irritable Substance abuse history and/or treatment for substance abuse?: Yes Suicide prevention information given to non-admitted patients: Not applicable  Risk to Others Homicidal Ideation: No Thoughts of Harm to Others: No Current Homicidal Intent: No Current Homicidal Plan: No Access to Homicidal Means: No History of harm to others?: No Assessment of Violence: None Noted Does patient have access to weapons?: No Criminal Charges Pending?: No Does patient have a court date: No  Psychosis Hallucinations: None noted Delusions: None noted  Mental Status Report Appear/Hygiene: Improved Eye Contact: Good Motor Activity: Freedom of movement Speech: Logical/coherent Level of Consciousness: Alert Mood: Depressed;Sad Affect: Appropriate to circumstance Anxiety Level: Minimal Thought Processes: Coherent;Relevant Judgement: Unimpaired Orientation: Person;Place;Time;Situation Obsessive Compulsive Thoughts/Behaviors: None  Cognitive Functioning Concentration: Normal Memory: Recent Intact;Remote Intact IQ: Average Insight: Fair Impulse Control: Fair Appetite: Good Sleep: No Change Total Hours of Sleep: 8  Vegetative Symptoms: None  ADLScreening [facility] Assessment Services) Patient's cognitive ability adequate to safely complete daily activities?: Yes Patient able to express need for assistance with ADLs?: Yes Independently performs ADLs?: Yes  Abuse/Neglect [facility]) Physical Abuse: Denies Verbal Abuse: Denies  Sexual Abuse:  Denies  Prior Inpatient Therapy Prior Inpatient Therapy: Yes Prior Therapy Dates: JAN 2013 Prior Therapy Facilty/Provider(s): [facility] Reason for Treatment: DEPRESSION, DRINKING  Prior Outpatient Therapy Prior Outpatient Therapy: No  ADL Screening (condition at time of admission) Patient's cognitive ability adequate to safely complete daily activities?: Yes Patient able to express need for assistance with ADLs?: Yes Independently performs ADLs?: Yes       Abuse/Neglect Assessment (Assessment to be complete while patient is alone) Physical Abuse: Denies Verbal Abuse: Denies Sexual Abuse: Denies Values / Beliefs Cultural Requests During Hospitalization: None Spiritual Requests During Hospitalization: None        Additional Information 1:1 In Past 12 Months?: No CIRT Risk: No Elopement Risk: No Does patient have medical clearance?: Yes     Disposition: REFERRED TO [facility]  Disposition Disposition of Patient: Referred to Patient referred to: Other (Comment) [facility])  On Site Evaluation by:   Reviewed with Physician:  DR Iran Sizer Winford 11/11/2011 4:24 PM

## 2012-04-03 ENCOUNTER — Emergency Department (HOSPITAL_COMMUNITY)
Admission: EM | Admit: 2012-04-03 | Discharge: 2012-04-03 | Disposition: A | Discharge status: Home / Self Care | Payer: Self-pay | Attending: Emergency Medicine | Admitting: Emergency Medicine

## 2012-04-03 ENCOUNTER — Emergency Department (HOSPITAL_COMMUNITY): Payer: Self-pay

## 2012-04-03 ENCOUNTER — Encounter (HOSPITAL_COMMUNITY): Payer: Self-pay | Admitting: Emergency Medicine

## 2012-04-03 DIAGNOSIS — F3289 Other specified depressive episodes: Secondary | ICD-10-CM | POA: Insufficient documentation

## 2012-04-03 DIAGNOSIS — F1010 Alcohol abuse, uncomplicated: Secondary | ICD-10-CM | POA: Insufficient documentation

## 2012-04-03 DIAGNOSIS — Z87898 Personal history of other specified conditions: Secondary | ICD-10-CM | POA: Insufficient documentation

## 2012-04-03 DIAGNOSIS — Z862 Personal history of diseases of the blood and blood-forming organs and certain disorders involving the immune mechanism: Secondary | ICD-10-CM | POA: Insufficient documentation

## 2012-04-03 DIAGNOSIS — E079 Disorder of thyroid, unspecified: Secondary | ICD-10-CM | POA: Insufficient documentation

## 2012-04-03 DIAGNOSIS — R209 Unspecified disturbances of skin sensation: Secondary | ICD-10-CM | POA: Insufficient documentation

## 2012-04-03 DIAGNOSIS — Z9119 Patient's noncompliance with other medical treatment and regimen: Secondary | ICD-10-CM | POA: Insufficient documentation

## 2012-04-03 DIAGNOSIS — F10929 Alcohol use, unspecified with intoxication, unspecified: Secondary | ICD-10-CM

## 2012-04-03 DIAGNOSIS — F17200 Nicotine dependence, unspecified, uncomplicated: Secondary | ICD-10-CM | POA: Insufficient documentation

## 2012-04-03 DIAGNOSIS — F329 Major depressive disorder, single episode, unspecified: Secondary | ICD-10-CM | POA: Insufficient documentation

## 2012-04-03 DIAGNOSIS — Z91199 Patient's noncompliance with other medical treatment and regimen due to unspecified reason: Secondary | ICD-10-CM | POA: Insufficient documentation

## 2012-04-03 DIAGNOSIS — Z8639 Personal history of other endocrine, nutritional and metabolic disease: Secondary | ICD-10-CM | POA: Insufficient documentation

## 2012-04-03 DIAGNOSIS — Z9889 Other specified postprocedural states: Secondary | ICD-10-CM | POA: Insufficient documentation

## 2012-04-03 LAB — CBC WITH DIFFERENTIAL/PLATELET
Hemoglobin: 17.9 g/dL — ABNORMAL HIGH (ref 13.0–17.0)
Lymphocytes Relative: 41 % (ref 12–46)
Lymphs Abs: 2.8 10*3/uL (ref 0.7–4.0)
Monocytes Relative: 5 % (ref 3–12)
Neutro Abs: 3.4 10*3/uL (ref 1.7–7.7)
Neutrophils Relative %: 50 % (ref 43–77)
RBC: 5.07 MIL/uL (ref 4.22–5.81)

## 2012-04-03 LAB — COMPREHENSIVE METABOLIC PANEL
ALT: 49 U/L (ref 0–53)
AST: 45 U/L — ABNORMAL HIGH (ref 0–37)
CO2: 28 mEq/L (ref 19–32)
Calcium: 10.5 mg/dL (ref 8.4–10.5)
GFR calc non Af Amer: >90 mL/min (ref >90)
Sodium: 149 mEq/L — ABNORMAL HIGH (ref 135–145)

## 2012-04-03 LAB — URINALYSIS, ROUTINE W REFLEX MICROSCOPIC
Glucose, UA: NEGATIVE mg/dL
Leukocytes, UA: NEGATIVE
Protein, ur: NEGATIVE mg/dL
pH: 7 (ref 5.0–8.0)

## 2012-04-03 LAB — RAPID URINE DRUG SCREEN, HOSP PERFORMED
Barbiturates: NOT DETECTED
Benzodiazepines: NOT DETECTED
Cocaine: NOT DETECTED
Tetrahydrocannabinol: NOT DETECTED

## 2012-04-03 LAB — TSH: TSH: 12.825 u[IU]/mL — ABNORMAL HIGH (ref 0.350–4.500)

## 2012-04-03 NOTE — ED Notes (Signed)
Report to [facility], dr.davidson now in agreement for in/out cath

## 2012-04-03 NOTE — ED Notes (Addendum)
Patient states "I don't know why I am here. My mother is an alcoholic and thinks when I drink I need to come to the hospital and get help. I was asleep and woke up to the cops here." Papers from PD state mother says patient is drinking heavily and using drugs. States he has taken a razor blade and holding it to his throat stating he is going to end it. Patient also states "I feel like my cancer is coming back. I'm having numbness in my hands and face and memory loss."

## 2012-04-03 NOTE — ED Notes (Signed)
Pt remains w/ [facility]d, shackled to stretcher. Denies si/hi, stated "just my mom", given am meal tray. Feeding self w/o difficulty

## 2012-04-03 NOTE — Progress Notes (Signed)
04/03/12 2:02 PM Telepsych examination completed and recommendation was to release pt to home. He signed a No Harm Contract. Involuntary commitment proceedings were terminated and pt was discharged to home. Provided pt with the telephone number to to [facility] for substance dependence.
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
## 2012-04-03 NOTE — ED Notes (Signed)
Pt has been to the bathroom multiple times to attempt to obtain urine specimen, pt ask to be cath for his urine. Dr.davidson notified, instructed staff to not cath him, he can use the urinal. Officers remain at bedside.
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
## 2014-01-22 IMAGING — CR DG CHEST 2V
2 series · 2 of 2 positions shown · non-contrast
Comparison: 10/06/2006

CLINICAL DATA: Left chest pain, history alcohol abuse and lymphoma

CHEST - 2 VIEW

[view not recorded (1 of 2)]
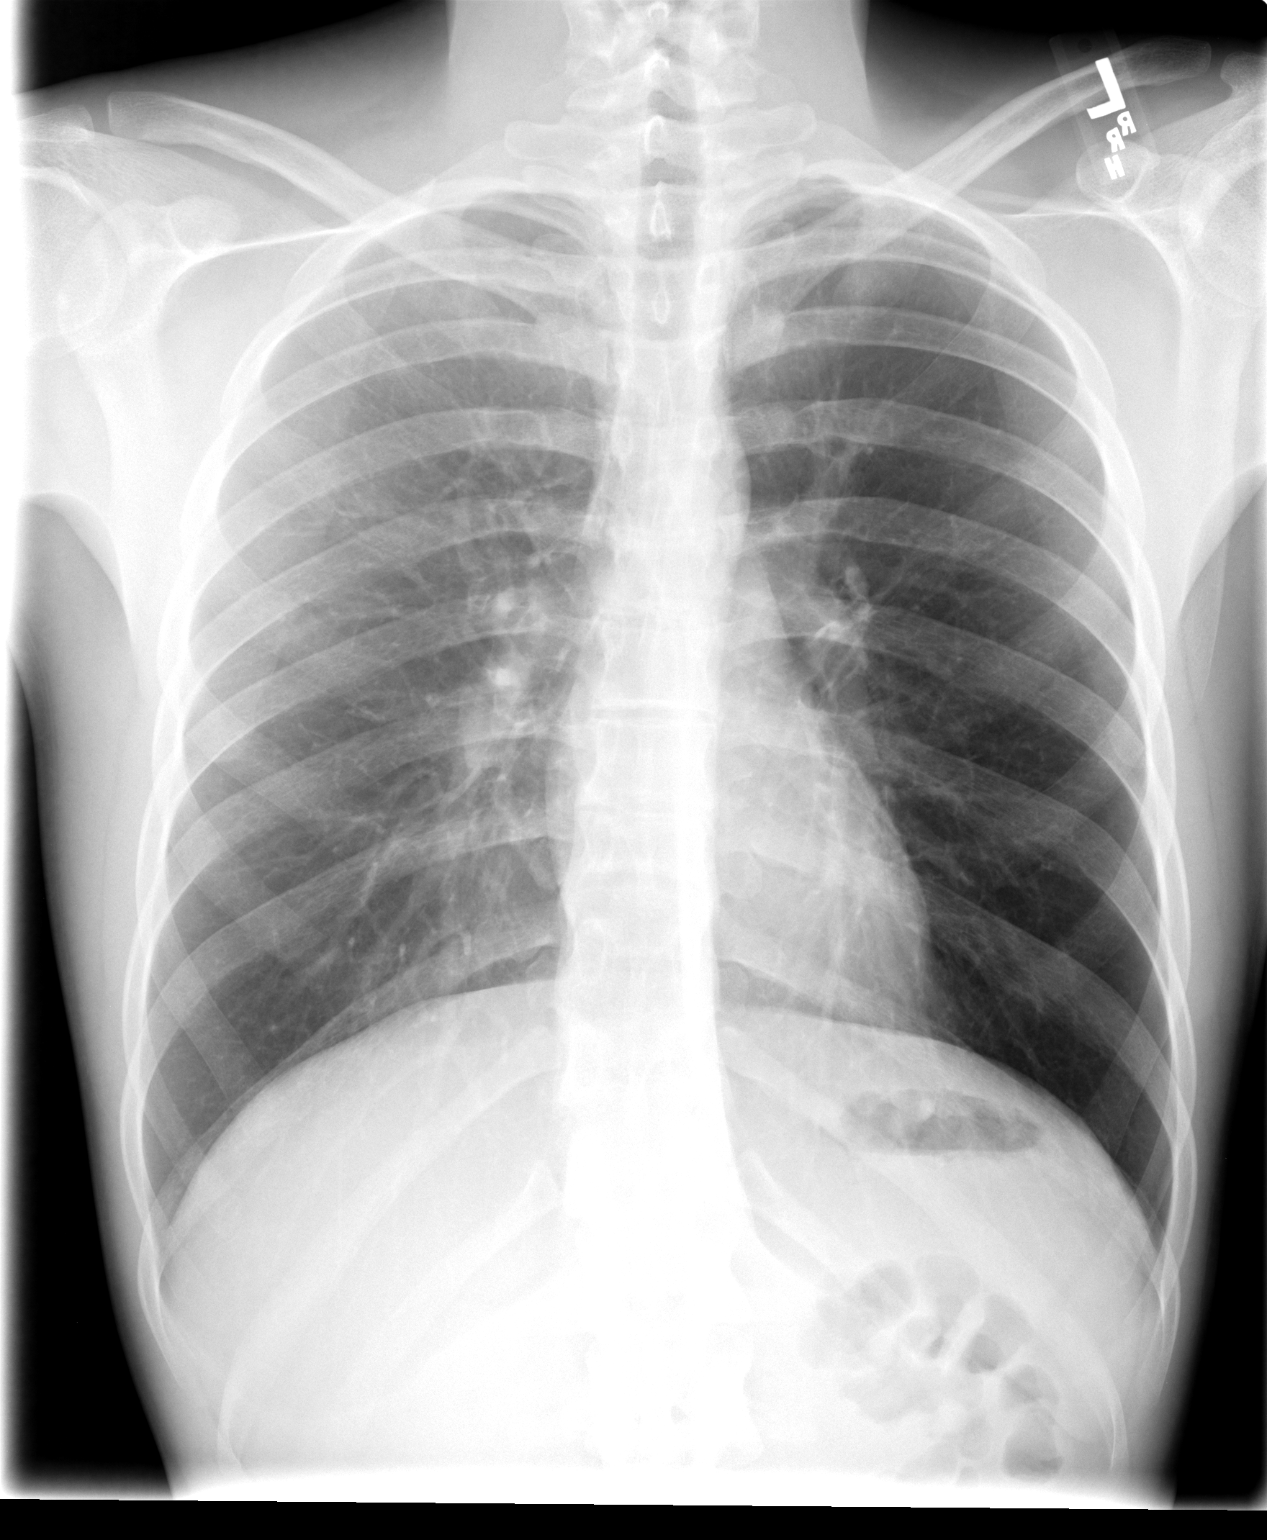

[view not recorded (2 of 2)]
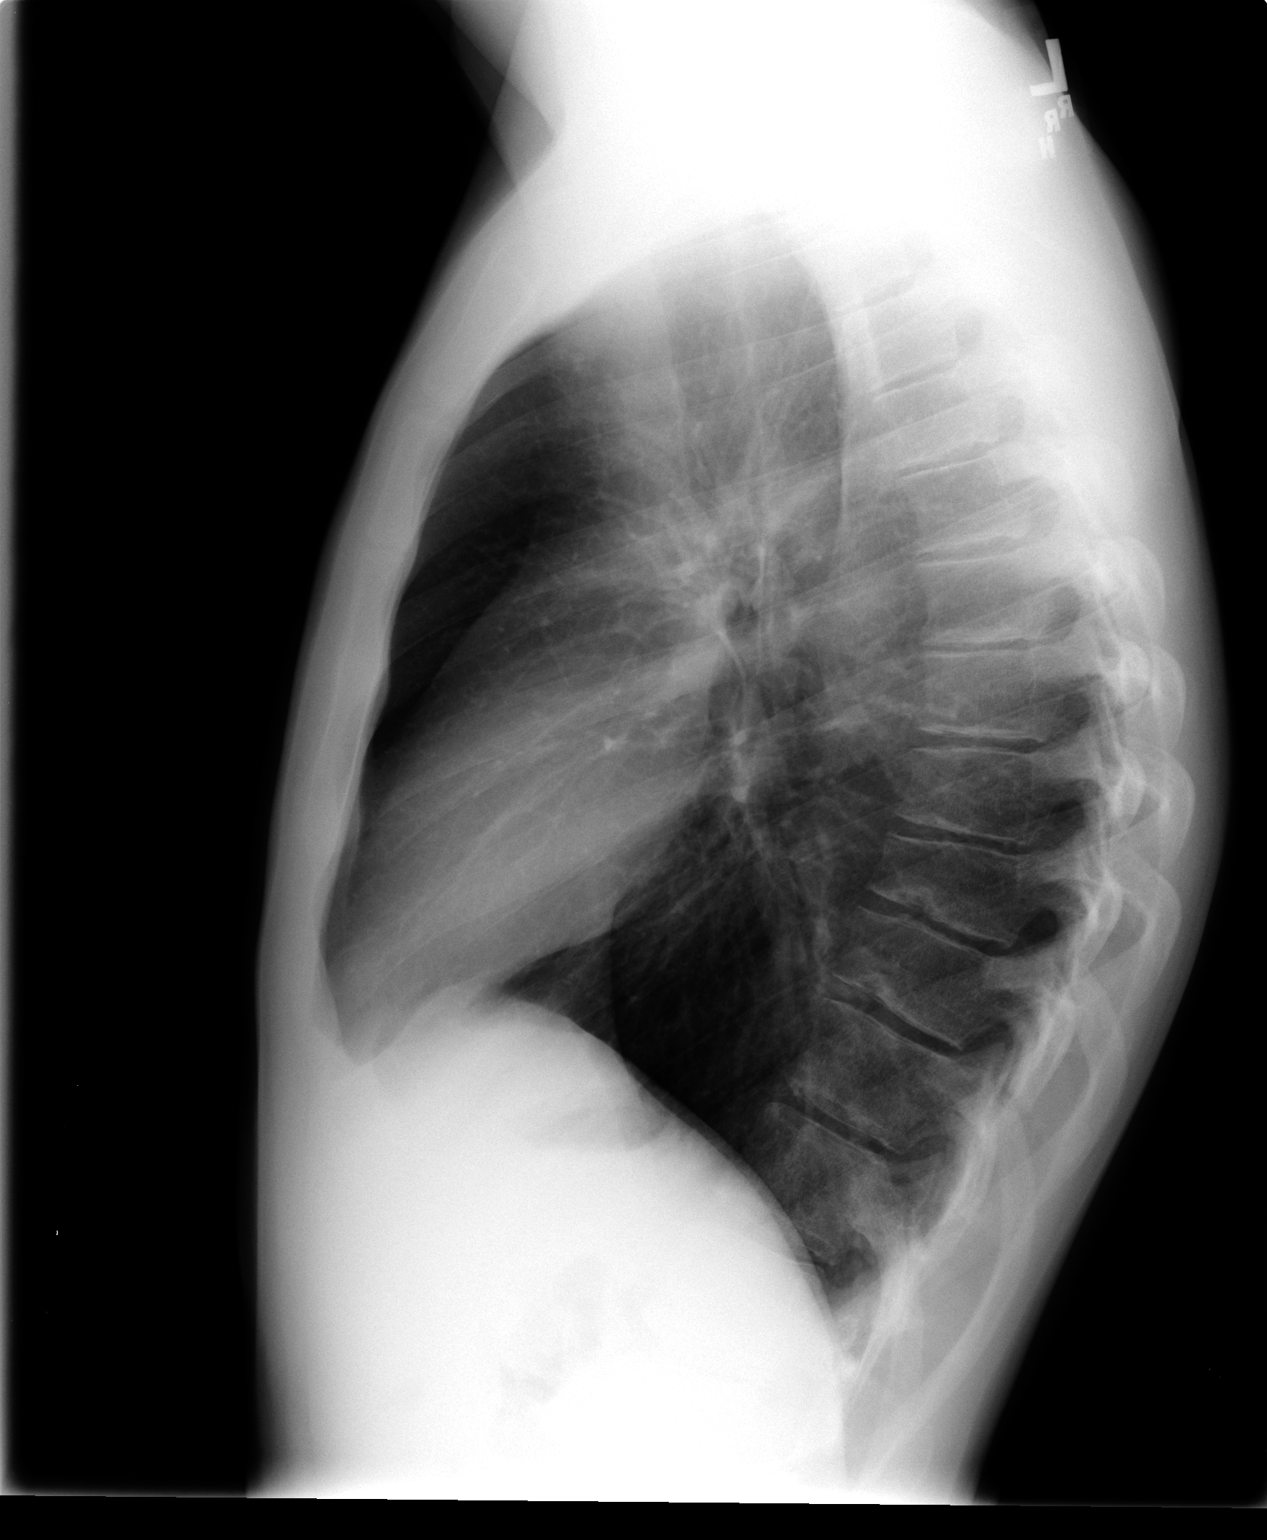

[2 of 2 positions shown; findings below may reference images not displayed]

FINDINGS: Normal heart size, mediastinal contours, and pulmonary vascularity.
Hyperinflated lungs without infiltrate, pleural effusion or
pneumothorax.
Few scattered Schmorl's nodes within thoracic vertebrae with
chronic height loss of T9 unchanged since previous exam.
IMPRESSION: Hyperinflated lungs without acute abnormalities.

## 2015-03-20 ENCOUNTER — Inpatient Hospital Stay (HOSPITAL_COMMUNITY)
Admission: AD | Admit: 2015-03-20 | Discharge: 2015-03-22 | DRG: 897 | Disposition: A | Discharge status: Home / Self Care | Payer: No Typology Code available for payment source | Source: Intra-hospital | Attending: Psychiatry | Admitting: Psychiatry

## 2015-03-20 ENCOUNTER — Encounter (HOSPITAL_COMMUNITY): Payer: Self-pay | Admitting: Emergency Medicine

## 2015-03-20 ENCOUNTER — Encounter (HOSPITAL_COMMUNITY): Payer: Self-pay | Admitting: *Deleted

## 2015-03-20 ENCOUNTER — Emergency Department (HOSPITAL_COMMUNITY)
Admission: EM | Admit: 2015-03-20 | Discharge: 2015-03-20 | Disposition: A | Discharge status: Other Acute Inpatient Hospital | Payer: No Typology Code available for payment source | Attending: Emergency Medicine | Admitting: Emergency Medicine

## 2015-03-20 DIAGNOSIS — F1020 Alcohol dependence, uncomplicated: Secondary | ICD-10-CM

## 2015-03-20 DIAGNOSIS — F329 Major depressive disorder, single episode, unspecified: Secondary | ICD-10-CM | POA: Diagnosis present

## 2015-03-20 DIAGNOSIS — F17200 Nicotine dependence, unspecified, uncomplicated: Secondary | ICD-10-CM | POA: Diagnosis present

## 2015-03-20 DIAGNOSIS — F1994 Other psychoactive substance use, unspecified with psychoactive substance-induced mood disorder: Secondary | ICD-10-CM | POA: Diagnosis not present

## 2015-03-20 DIAGNOSIS — F1310 Sedative, hypnotic or anxiolytic abuse, uncomplicated: Secondary | ICD-10-CM | POA: Insufficient documentation

## 2015-03-20 DIAGNOSIS — R4781 Slurred speech: Secondary | ICD-10-CM | POA: Insufficient documentation

## 2015-03-20 DIAGNOSIS — F410 Panic disorder [episodic paroxysmal anxiety] without agoraphobia: Secondary | ICD-10-CM | POA: Diagnosis present

## 2015-03-20 DIAGNOSIS — Z8572 Personal history of non-Hodgkin lymphomas: Secondary | ICD-10-CM

## 2015-03-20 DIAGNOSIS — F10920 Alcohol use, unspecified with intoxication, uncomplicated: Secondary | ICD-10-CM

## 2015-03-20 DIAGNOSIS — Z720 Tobacco use: Secondary | ICD-10-CM | POA: Insufficient documentation

## 2015-03-20 DIAGNOSIS — Z86018 Personal history of other benign neoplasm: Secondary | ICD-10-CM | POA: Insufficient documentation

## 2015-03-20 DIAGNOSIS — G4700 Insomnia, unspecified: Secondary | ICD-10-CM | POA: Diagnosis present

## 2015-03-20 DIAGNOSIS — X788XXA Intentional self-harm by other sharp object, initial encounter: Secondary | ICD-10-CM | POA: Insufficient documentation

## 2015-03-20 DIAGNOSIS — Y9289 Other specified places as the place of occurrence of the external cause: Secondary | ICD-10-CM | POA: Insufficient documentation

## 2015-03-20 DIAGNOSIS — F411 Generalized anxiety disorder: Secondary | ICD-10-CM | POA: Diagnosis present

## 2015-03-20 DIAGNOSIS — Z8659 Personal history of other mental and behavioral disorders: Secondary | ICD-10-CM | POA: Insufficient documentation

## 2015-03-20 DIAGNOSIS — Z811 Family history of alcohol abuse and dependence: Secondary | ICD-10-CM

## 2015-03-20 DIAGNOSIS — Y9389 Activity, other specified: Secondary | ICD-10-CM | POA: Insufficient documentation

## 2015-03-20 DIAGNOSIS — S0101XA Laceration without foreign body of scalp, initial encounter: Secondary | ICD-10-CM | POA: Insufficient documentation

## 2015-03-20 DIAGNOSIS — S1081XA Abrasion of other specified part of neck, initial encounter: Secondary | ICD-10-CM | POA: Insufficient documentation

## 2015-03-20 DIAGNOSIS — Z7289 Other problems related to lifestyle: Secondary | ICD-10-CM

## 2015-03-20 DIAGNOSIS — Y998 Other external cause status: Secondary | ICD-10-CM | POA: Insufficient documentation

## 2015-03-20 DIAGNOSIS — Z8639 Personal history of other endocrine, nutritional and metabolic disease: Secondary | ICD-10-CM | POA: Insufficient documentation

## 2015-03-20 DIAGNOSIS — IMO0002 Reserved for concepts with insufficient information to code with codable children: Secondary | ICD-10-CM

## 2015-03-20 DIAGNOSIS — F10129 Alcohol abuse with intoxication, unspecified: Secondary | ICD-10-CM | POA: Insufficient documentation

## 2015-03-20 LAB — URINE MICROSCOPIC-ADD ON

## 2015-03-20 LAB — URINALYSIS, ROUTINE W REFLEX MICROSCOPIC
BILIRUBIN URINE: NEGATIVE
GLUCOSE, UA: NEGATIVE mg/dL
Ketones, ur: NEGATIVE mg/dL
Leukocytes, UA: NEGATIVE
Nitrite: NEGATIVE
PH: 6 (ref 5.0–8.0)
Protein, ur: NEGATIVE mg/dL
UROBILINOGEN UA: 0.2 mg/dL (ref 0.0–1.0)

## 2015-03-20 LAB — RAPID URINE DRUG SCREEN, HOSP PERFORMED
AMPHETAMINES: NOT DETECTED
BARBITURATES: NOT DETECTED
Benzodiazepines: POSITIVE — AB
Cocaine: NOT DETECTED
Opiates: NOT DETECTED
TETRAHYDROCANNABINOL: NOT DETECTED

## 2015-03-20 LAB — COMPREHENSIVE METABOLIC PANEL
ALT: 36 U/L (ref 17–63)
AST: 35 U/L (ref 15–41)
Albumin: 4.9 g/dL (ref 3.5–5.0)
Alkaline Phosphatase: 64 U/L (ref 38–126)
Anion gap: 9 (ref 5–15)
BILIRUBIN TOTAL: 0.3 mg/dL (ref 0.3–1.2)
BUN: 6 mg/dL (ref 6–20)
CO2: 31 mmol/L (ref 22–32)
Calcium: 9.7 mg/dL (ref 8.9–10.3)
Chloride: 104 mmol/L (ref 101–111)
Creatinine, Ser: 0.81 mg/dL (ref 0.61–1.24)
Glucose, Bld: 106 mg/dL — ABNORMAL HIGH (ref 65–99)
POTASSIUM: 4.5 mmol/L (ref 3.5–5.1)
Sodium: 144 mmol/L (ref 135–145)
TOTAL PROTEIN: 7.6 g/dL (ref 6.5–8.1)

## 2015-03-20 LAB — CBC WITH DIFFERENTIAL/PLATELET
BASOS ABS: 0 10*3/uL (ref 0.0–0.1)
Basophils Relative: 1 %
EOS ABS: 0.3 10*3/uL (ref 0.0–0.7)
EOS PCT: 4 %
HCT: 43.8 % (ref 39.0–52.0)
Hemoglobin: 15.3 g/dL (ref 13.0–17.0)
LYMPHS PCT: 36 %
Lymphs Abs: 2.6 10*3/uL (ref 0.7–4.0)
MCH: 34.3 pg — ABNORMAL HIGH (ref 26.0–34.0)
MCHC: 34.9 g/dL (ref 30.0–36.0)
MCV: 98.2 fL (ref 78.0–100.0)
Monocytes Absolute: 0.5 10*3/uL (ref 0.1–1.0)
Monocytes Relative: 8 %
Neutro Abs: 3.7 10*3/uL (ref 1.7–7.7)
Neutrophils Relative %: 51 %
PLATELETS: 212 10*3/uL (ref 150–400)
RBC: 4.46 MIL/uL (ref 4.22–5.81)
RDW: 13 % (ref 11.5–15.5)
WBC: 7.1 10*3/uL (ref 4.0–10.5)

## 2015-03-20 LAB — ETHANOL
Alcohol, Ethyl (B): 356 mg/dL (ref <5)
Alcohol, Ethyl (B): 205 mg/dL — ABNORMAL HIGH (ref <5)

## 2015-03-20 MED ORDER — CHLORDIAZEPOXIDE HCL 25 MG PO CAPS: 25.0000 mg | ORAL_CAPSULE | Freq: Every day | ORAL | Status: DC

## 2015-03-20 MED ORDER — CHLORDIAZEPOXIDE HCL 25 MG PO CAPS: 25.0000 mg | ORAL_CAPSULE | ORAL | Status: DC

## 2015-03-20 MED ORDER — VITAMIN B-1 100 MG PO TABS
100.0000 mg | ORAL_TABLET | Freq: Every day | ORAL | Status: DC
  Administered 2015-03-20: 100 mg via ORAL
  Filled 2015-03-20: qty 1

## 2015-03-20 MED ORDER — ACETAMINOPHEN 325 MG PO TABS: 650.0000 mg | ORAL_TABLET | Freq: Four times a day (QID) | ORAL | Status: DC | PRN

## 2015-03-20 MED ORDER — CHLORDIAZEPOXIDE HCL 25 MG PO CAPS
25.0000 mg | ORAL_CAPSULE | Freq: Four times a day (QID) | ORAL | Status: DC | PRN
Start: 2015-03-20 — End: 2015-03-22

## 2015-03-20 MED ORDER — CHLORDIAZEPOXIDE HCL 25 MG PO CAPS
ORAL_CAPSULE | ORAL | Status: AC
  Filled 2015-03-20: qty 2

## 2015-03-20 MED ORDER — ONDANSETRON 4 MG PO TBDP: 4.0000 mg | ORAL_TABLET | Freq: Four times a day (QID) | ORAL | Status: DC | PRN

## 2015-03-20 MED ORDER — HYDROXYZINE HCL 25 MG PO TABS
ORAL_TABLET | ORAL | Status: AC
  Administered 2015-03-20: 23:00:00
  Filled 2015-03-20: qty 1

## 2015-03-20 MED ORDER — ALUM & MAG HYDROXIDE-SIMETH 200-200-20 MG/5ML PO SUSP: 30.0000 mL | ORAL | Status: DC | PRN

## 2015-03-20 MED ORDER — HYDROXYZINE HCL 25 MG PO TABS
25.0000 mg | ORAL_TABLET | Freq: Four times a day (QID) | ORAL | Status: DC | PRN
Start: 2015-03-20 — End: 2015-03-22

## 2015-03-20 MED ORDER — LIDOCAINE HCL (PF) 1 % IJ SOLN
INTRAMUSCULAR | Status: AC
  Administered 2015-03-20: 05:00:00
  Filled 2015-03-20: qty 15

## 2015-03-20 MED ORDER — IBUPROFEN 400 MG PO TABS: 600.0000 mg | ORAL_TABLET | Freq: Three times a day (TID) | ORAL | Status: DC | PRN

## 2015-03-20 MED ORDER — LORAZEPAM 1 MG PO TABS: 0.0000 mg | ORAL_TABLET | Freq: Two times a day (BID) | ORAL | Status: DC

## 2015-03-20 MED ORDER — LOPERAMIDE HCL 2 MG PO CAPS
2.0000 mg | ORAL_CAPSULE | ORAL | Status: DC | PRN
Start: 2015-03-20 — End: 2015-03-22

## 2015-03-20 MED ORDER — THIAMINE HCL 100 MG/ML IJ SOLN: 100.0000 mg | Freq: Every day | INTRAMUSCULAR | Status: DC

## 2015-03-20 MED ORDER — CHLORDIAZEPOXIDE HCL 25 MG PO CAPS
25.0000 mg | ORAL_CAPSULE | Freq: Three times a day (TID) | ORAL | Status: DC
  Administered 2015-03-22: 25 mg via ORAL
  Filled 2015-03-20 (×2): qty 1

## 2015-03-20 MED ORDER — THIAMINE HCL 100 MG/ML IJ SOLN: 100.0000 mg | Freq: Once | INTRAMUSCULAR | Status: DC

## 2015-03-20 MED ORDER — LORAZEPAM 1 MG PO TABS
0.0000 mg | ORAL_TABLET | Freq: Four times a day (QID) | ORAL | Status: DC
  Administered 2015-03-20 (×2): 1 mg via ORAL
  Filled 2015-03-20 (×2): qty 1

## 2015-03-20 MED ORDER — MAGNESIUM HYDROXIDE 400 MG/5ML PO SUSP: 30.0000 mL | Freq: Every day | ORAL | Status: DC | PRN

## 2015-03-20 MED ORDER — LIDOCAINE HCL (PF) 1 % IJ SOLN
INTRAMUSCULAR | Status: AC
  Administered 2015-03-20: 05:00:00
  Filled 2015-03-20: qty 5

## 2015-03-20 MED ORDER — NICOTINE 21 MG/24HR TD PT24
21.0000 mg | MEDICATED_PATCH | Freq: Every day | TRANSDERMAL | Status: DC
  Filled 2015-03-20: qty 1

## 2015-03-20 MED ORDER — CHLORDIAZEPOXIDE HCL 25 MG PO CAPS
25.0000 mg | ORAL_CAPSULE | Freq: Four times a day (QID) | ORAL | Status: AC
  Administered 2015-03-20 – 2015-03-22 (×6): 25 mg via ORAL
  Filled 2015-03-20 (×4): qty 1

## 2015-03-20 MED ORDER — ONDANSETRON HCL 4 MG PO TABS: 4.0000 mg | ORAL_TABLET | Freq: Three times a day (TID) | ORAL | Status: DC | PRN

## 2015-03-20 MED ORDER — ADULT MULTIVITAMIN W/MINERALS CH
1.0000 | ORAL_TABLET | Freq: Every day | ORAL | Status: DC
  Administered 2015-03-21 – 2015-03-22 (×2): 1 via ORAL
  Filled 2015-03-20 (×4): qty 1

## 2015-03-20 MED ORDER — ACETAMINOPHEN 325 MG PO TABS: 650.0000 mg | ORAL_TABLET | ORAL | Status: DC | PRN

## 2015-03-20 MED ORDER — ZOLPIDEM TARTRATE 5 MG PO TABS: 10.0000 mg | ORAL_TABLET | Freq: Every evening | ORAL | Status: DC | PRN

## 2015-03-20 MED ORDER — VITAMIN B-1 100 MG PO TABS
100.0000 mg | ORAL_TABLET | Freq: Every day | ORAL | Status: DC
  Administered 2015-03-21 – 2015-03-22 (×2): 100 mg via ORAL
  Filled 2015-03-20 (×4): qty 1

## 2015-03-20 NOTE — BH Assessment (Signed)
Clinician to initiate TTS assessment.   Rigoberto Noel, MSW, LCSW Triage Specialist [phone],

## 2015-03-20 NOTE — ED Notes (Signed)
Critical CRITICAL VALUE ALERT  Critical value received: ETOH 356 Date of notification:  03/20/15 Time of notification:  2878 Critical value read back:Yes.   Nurse who received alert: GMP MD notified (1st page): Dr. Tomi Bamberger Responding MD: Dr. Tomi Bamberger Time MD responded:  0600

## 2015-03-20 NOTE — BH Assessment (Signed)
Assessment Note  JAVAR ESHBACH is an 24 y.o. male who presents involuntarily to [facility]. *NOTED: video down on tele-assessment at time of assessment. Clinician was unable see patient but could hear patient.* Patient reported coming to ED due to self inflicted lacerations on his forehead by a razor. 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 Patient states "that's what they do on [employer]." Patient denies SI, HI, AVH and SA. Patient UDS positive for Benzos and BAL 356 at 0427. Per Dr. Tomi Bamberger patient requesting medication for anxiety. Patient was anxious to return home stating, he has to go to sleep before work on Monday morning. Patient stated, he works a full time job and lives at home with parents.  Patient was at [facility] in 2012, 2011, 2009 due SA but patient denies previous inpatient.  Patient reports no current outpatient tx. When prompted about mood, patient stated, "I'm good, I just want to go home."   Per Darlyne Russian, PA patient to be reassessed by psychiatry due to BAL level. Dr. Tomi Bamberger also concerned if patient being honest about intentions due to older cuts on his neck which per MD patient stated were from falling off a roof doing construction.   Diagnosis: Alcohol Use Disorder, severe  Past Medical History:  Past Medical History  Diagnosis Date  . Lymphoma ([place])   . Depression   . Thyroid disease   . Alcohol abuse     Past Surgical History  Procedure Laterality Date  . Appendectomy      Family History: History reviewed. No pertinent family history.  Social History:  reports that he has been smoking.  He does not have any smokeless tobacco history on file. He reports that he drinks alcohol. He reports that he uses illicit drugs.  Additional Social History:  Alcohol / Drug Use Pain Medications: none reported  Prescriptions: none reported Over the Counter: none reported History of alcohol / drug use?: Yes  (Patient denies any recent alcohol or drug use. Information obtained by UDS. ) Longest period of sobriety (when/how long): unk Substance #1 Name of Substance 1: benzos 1 - Age of First Use: unk 1 - Amount (size/oz): unk 1 - Frequency: unk 1 - Duration: unk 1 - Last Use / Amount: unk Substance #2 Name of Substance 2: etoh 2 - Age of First Use: unk 2 - Amount (size/oz): unk 2 - Frequency: unk 2 - Duration: unk 2 - Last Use / Amount: unk  CIWA: CIWA-Ar BP: 120/83 mmHg Pulse Rate: 102 COWS:    Allergies:  Allergies  Allergen Reactions  . Morphine And Related Itching    Home Medications:  (Not in a hospital admission)  OB/GYN Status:  No LMP for male patient.  General Assessment Data Location of Assessment: [facility] TTS Assessment: In system Is this a Tele or Face-to-Face Assessment?: Tele Assessment Is this an Initial Assessment or a Re-assessment for this encounter?: Initial Assessment Marital status: Single Maiden name: NA Is patient pregnant?: Other (Comment) (NA) Pregnancy Status: Other (Comment) Living Arrangements: Parent Can pt return to current living arrangement?: Yes Admission Status: Involuntary Is patient capable of signing voluntary admission?: Yes Referral Source: Self/Family/Friend Insurance type: self pay     Crisis Care Plan Living Arrangements: Parent Name of Psychiatrist: none Name of Therapist: none     Risk to self with the past 6 months Suicidal Ideation: No Has patient been a risk  to self within the past 6 months prior to admission? : Yes Suicidal Intent: No Has patient had any suicidal intent within the past 6 months prior to admission? : No Is patient at risk for suicide?: No Suicidal Plan?: No Has patient had any suicidal plan within the past 6 months prior to admission? : No Access to Means: Yes Specify Access to Suicidal Means: cut self with razor What has been your use of drugs/alcohol within the last 12 months?: UDS shows +  for benzos Previous Attempts/Gestures: Yes How many times?:  (unk) Other Self Harm Risks: yes Triggers for Past Attempts: Other (Comment) (patient reports its for wrestling) Intentional Self Injurious Behavior: Cutting Comment - Self Injurious Behavior: Patient cut forehead with razor Family Suicide History: Unknown Recent stressful life event(s): Other (Comment) (unk) Persecutory voices/beliefs?: No Depression: No Depression Symptoms: Feeling angry/irritable Substance abuse history and/or treatment for substance abuse?: Yes Suicide prevention information given to non-admitted patients: Not applicable  Risk to Others within the past 6 months Homicidal Ideation: No Does patient have any lifetime risk of violence toward others beyond the six months prior to admission? : Unknown Thoughts of Harm to Others: No Current Homicidal Intent: No Current Homicidal Plan: No Access to Homicidal Means: No Identified Victim: NA History of harm to others?: No Assessment of Violence: None Noted Violent Behavior Description: Patient cooperative at assessment. Does patient have access to weapons?: No Criminal Charges Pending?: No Does patient have a court date: No Is patient on probation?: No  Psychosis Hallucinations: None noted Delusions: None noted  Mental Status Report Appearance/Hygiene: Unable to Assess (Tele- assessment unable to show visual) Eye Contact: Unable to Assess Motor Activity: Unable to assess Speech: Logical/coherent Level of Consciousness: Unable to assess Mood: Euthymic Affect: Anxious Anxiety Level: Moderate Thought Processes: Coherent, Relevant Judgement: Impaired Orientation: Person, Place, Time, Situation Obsessive Compulsive Thoughts/Behaviors: Minimal  Cognitive Functioning Concentration: Normal Memory: Recent Intact, Remote Intact IQ: Average Insight: Poor Impulse Control: Poor Appetite: Fair Weight Loss:  (unk) Weight Gain:  (unk) Sleep: No  Change Total Hours of Sleep:  (unk) Vegetative Symptoms: None  ADLScreening [facility] Assessment Services) Patient's cognitive ability adequate to safely complete daily activities?: Yes Patient able to express need for assistance with ADLs?: Yes Independently performs ADLs?: Yes (appropriate for developmental age)  Prior Inpatient Therapy Prior Inpatient Therapy: Yes Prior Therapy Dates: 2012, 2011, 2009 Prior Therapy Facilty/Provider(s): [facility] Reason for Treatment: SA  Prior Outpatient Therapy Prior Outpatient Therapy: No Prior Therapy Dates: NA Prior Therapy Facilty/Provider(s): NA Reason for Treatment: NA Does patient have an ACCT team?: No Does patient have Intensive In-House Services?  : No Does patient have [facility] services? : No Does patient have P4CC services?: No  ADL Screening (condition at time of admission) Patient's cognitive ability adequate to safely complete daily activities?: Yes Is the patient deaf or have difficulty hearing?: No Does the patient have difficulty seeing, even when wearing glasses/contacts?: No Does the patient have difficulty concentrating, remembering, or making decisions?: No Patient able to express need for assistance with ADLs?: Yes Does the patient have difficulty dressing or bathing?: No Independently performs ADLs?: Yes (appropriate for developmental age)       Abuse/Neglect Assessment (Assessment to be complete while patient is alone) Physical Abuse: Denies Verbal Abuse: Denies Sexual Abuse: Denies Exploitation of patient/patient's resources: Denies Self-Neglect: Denies     Regulatory affairs officer (For Healthcare) Does patient have an advance directive?: No    Additional Information 1:1 In Past 12 Months?: No CIRT  Risk: No Elopement Risk: Yes Does patient have medical clearance?: Yes     Disposition:  Disposition Initial Assessment Completed for this Encounter: Yes Disposition of Patient: Other dispositions (Reassess by  psychiatry when BAL is reduced.) Other disposition(s): Other (Comment)  On Site Evaluation by:   Reviewed with Physician:    Essie Christine 03/20/2015 6:54 AM

## 2015-03-20 NOTE — ED Provider Notes (Signed)
CSN: [id number]     Arrival date & time 03/20/15  4076 History   First MD Initiated Contact with Patient 03/20/15 0340     No chief complaint on file.  Level V caveat for alcohol intoxication  (Consider location/radiation/quality/duration/timing/severity/associated sxs/prior Treatment) HPI patient was brought to the emergency department by the sheriff's department. They report his mother called stating he been drinking and he had been cutting himself. He has a history of self-mutilation and the sheriff's deputies are very familiar with him. Patient does admit to drinking 4-5 beers tonight. He states he and a buddy, although there was no other person at his house per police, were pretending to wrestle. He states in the [place] when they are fighting they cut their foreheads with razor blades to make themselves bleed. He states that's when he did tonight. He states he is not suicidal. Patient appears to be very intoxicated. The sheriff's deputies have already started IVC papers. Per police he had a positive breathalyzer at 0.32.  PCP none Psychiatrist none Therapist none  Past Medical History  Diagnosis Date  . Lymphoma ([place])   . Depression   . Thyroid disease   . Alcohol abuse    Past Surgical History  Procedure Laterality Date  . Appendectomy     History reviewed. No pertinent family history. Social History  Substance Use Topics  . Smoking status: Current Every Day Smoker  . Smokeless tobacco: None  . 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     Allergies  Morphine and related  Home Medications   Prior to Admission medications   Medication Sig Start Date End Date Taking? Authorizing Provider  acetaminophen (TYLENOL) 325 MG tablet Take 650 mg by mouth every 6 (six) hours as needed. For pain     Historical Provider, MD   BP 120/83 mmHg  Pulse 102  Temp(Src) 98.  4 F (36.9 C) (Oral)  Resp 20  Ht 5\' 6"  (1.676 m)  Wt 120 lb (54.432 kg)  BMI 19.38 kg/m2  SpO2 99%  Vital signs normal except for tachycardia    Physical Exam  Constitutional: He is oriented to person, place, and time. He appears well-developed and well-nourished.  Non-toxic appearance. He does not appear ill. No distress.  Appears intoxicated  HENT:  Head: Normocephalic and atraumatic.  Right Ear: External ear normal.  Left Ear: External ear normal.  Nose: Nose normal. No mucosal edema or rhinorrhea.  Mouth/Throat: Oropharynx is clear and moist and mucous membranes are normal. No dental abscesses or uvula swelling.  Has dried blood all over his face and neck  Eyes: Conjunctivae and EOM are normal. Pupils are equal, round, and reactive to light.  Neck: Normal range of motion and full passive range of motion without pain. Neck supple.  Cardiovascular: Normal rate, regular rhythm and normal heart sounds.  Exam reveals no gallop and no friction rub.   No murmur heard. Pulmonary/Chest: Effort normal and breath sounds normal. No respiratory distress. He has no wheezes. He has no rhonchi. He has no rales. He exhibits no tenderness and no crepitus.  Abdominal: Soft. Normal appearance and bowel sounds are normal. He exhibits no distension. There is no tenderness. There is no rebound and no guarding.  Musculoskeletal: Normal range of motion. He exhibits no edema or tenderness.  Moves all extremities well.   Neurological: He is alert and oriented to  person, place, and time. He has normal strength. No cranial nerve deficit.  Skin: Skin is warm, dry and intact. No rash noted. No erythema. No pallor.  Patient is noted to have several linear abrasions to his right neck. He states he got those while working on a house. They also appear to be a razor type injury that are not as deep as the ones on his forehead. Patient has 2 linear parallel lacerations near his scalp line. The most superior wound is  approximately 2 cm deep in the part that is through the dermis. The inferior laceration although much longer is only 3-1/2 cm in the area that's through the dermis.  Psychiatric: His speech is slurred. He is slowed.  Nursing note and vitals reviewed.        ED Course  Procedures (including critical care time)  Patient initially refused to have sutures of his forehead laceration.   Patient then almost immediately changed his mind, but he wants staples. States "I will just have another cut there next week when I wrestle".   Pt starting to repetively ask for something to calm his nerves. Advised he would have to wait for his alcohol level to return. He is also saying his mother is in the waiting room and he is ready to leave. Actually the sheriff's department has not started IVC papers.   05:19 IVC papers filled out and signed by me  5:50 AM patient's labs were resulted. Patient is still heavily intoxicated. Psych holding orders were placed. CIWA protocol initiated.  06:15 Delilah, TSS has talked to patient, will discuss with PA, may need to have psychiatrist evaluate later this morning. Pt denies SI to her, however the visual on the monitor wasn't working, but she was able to see the pictures of his wounds in my chart.    LACERATION REPAIR Performed by: Janice Norrie Authorized by: Janice Norrie Consent: Verbal consent obtained. Risks and benefits: risks, benefits and alternatives were discussed Consent given by: patient Patient identity confirmed: provided demographic data Prepped and Draped in normal sterile fashion Wound explored  Laceration Location: superior laceration on forehead  Laceration Length: 2 cm  No Foreign Bodies seen or palpated  Anesthesia: local infiltration  Local anesthetic: lidocaine 1%   Anesthetic total: 4 ml  Irrigation method: syringe Amount of cleaning: standard  Skin closure: 5-0 nylon  Number of sutures: 3  Technique: simple  interrupted  Patient tolerance: Patient tolerated the procedure well with no immediate complications.  Note ends were left long to help distinguish from hair  LACERATION REPAIR Performed by: Rolland Porter L Authorized by: Janice Norrie Consent: Verbal consent obtained. Risks and benefits: risks, benefits and alternatives were discussed Consent given by: patient Patient identity confirmed: provided demographic data Prepped and Draped in normal sterile fashion Wound explored  Laceration Location: inferior laceration on  forehead  Laceration Length: 3.5 cm  No Foreign Bodies seen or palpated  Anesthesia: local infiltration  Local anesthetic: lidocaine 1%   Anesthetic total: 5 ml  Irrigation method: syringe Amount of cleaning: standard  Skin closure: 5-0 nylon  Number of sutures: 5  Technique: simple interrupted  Patient tolerance: Patient tolerated the procedure well with no immediate complications.  Note ends were left long to help distinguish from hair    Labs Review Results for orders placed or performed during the hospital encounter of 03/20/15  Urinalysis, Routine w reflex microscopic (not at [facility])  Result Value Ref Range   Color, Urine YELLOW YELLOW   APPearance  CLEAR CLEAR   Specific Gravity, Urine <1.005 (L) 1.005 - 1.030   pH 6.0 5.0 - 8.0   Glucose, UA NEGATIVE NEGATIVE mg/dL   Hgb urine dipstick MODERATE (A) NEGATIVE   Bilirubin Urine NEGATIVE NEGATIVE   Ketones, ur NEGATIVE NEGATIVE mg/dL   Protein, ur NEGATIVE NEGATIVE mg/dL   Urobilinogen, UA 0.2 0.0 - 1.0 mg/dL   Nitrite NEGATIVE NEGATIVE   Leukocytes, UA NEGATIVE NEGATIVE  Urine rapid drug screen (hosp performed)  Result Value Ref Range   Opiates NONE DETECTED NONE DETECTED   Cocaine NONE DETECTED NONE DETECTED   Benzodiazepines POSITIVE (A) NONE DETECTED   Amphetamines NONE DETECTED NONE DETECTED   Tetrahydrocannabinol NONE DETECTED NONE DETECTED   Barbiturates NONE DETECTED NONE DETECTED   Ethanol  Result Value Ref Range   Alcohol, Ethyl (B) 356 (HH) <5 mg/dL  Comprehensive metabolic panel  Result Value Ref Range   Sodium 144 135 - 145 mmol/L   Potassium 4.5 3.5 - 5.1 mmol/L   Chloride 104 101 - 111 mmol/L   CO2 31 22 - 32 mmol/L   Glucose, Bld 106 (H) 65 - 99 mg/dL   BUN 6 6 - 20 mg/dL   Creatinine, Ser 0.81 0.61 - 1.24 mg/dL   Calcium 9.7 8.9 - 10.3 mg/dL   Total Protein 7.6 6.5 - 8.1 g/dL   Albumin 4.9 3.5 - 5.0 g/dL   AST 35 15 - 41 U/L   ALT 36 17 - 63 U/L   Alkaline Phosphatase 64 38 - 126 U/L   Total Bilirubin 0.3 0.3 - 1.2 mg/dL   GFR calc non Af Amer >60 >60 mL/min   GFR calc Af Amer >60 >60 mL/min   Anion gap 9 5 - 15  CBC with Differential  Result Value Ref Range   WBC 7.1 4.0 - 10.5 K/uL   RBC 4.46 4.22 - 5.81 MIL/uL   Hemoglobin 15.3 13.0 - 17.0 g/dL   HCT 43.8 39.0 - 52.0 %   MCV 98.2 78.0 - 100.0 fL   MCH 34.3 (H) 26.0 - 34.0 pg   MCHC 34.9 30.0 - 36.0 g/dL   RDW 13.0 11.5 - 15.5 %   Platelets 212 150 - 400 K/uL   Neutrophils Relative % 51 %   Neutro Abs 3.7 1.7 - 7.7 K/uL   Lymphocytes Relative 36 %   Lymphs Abs 2.6 0.7 - 4.0 K/uL   Monocytes Relative 8 %   Monocytes Absolute 0.5 0.1 - 1.0 K/uL   Eosinophils Relative 4 %   Eosinophils Absolute 0.3 0.0 - 0.7 K/uL   Basophils Relative 1 %   Basophils Absolute 0.0 0.0 - 0.1 K/uL  Urine microscopic-add on  Result Value Ref Range   Squamous Epithelial / LPF RARE RARE   WBC, UA 0-2 <3 WBC/hpf   RBC / HPF 0-2 <3 RBC/hpf   Bacteria, UA RARE RARE   Laboratory interpretation all normal exceptalcohol intoxication, positive UDS   Imaging Review No results found. I have personally reviewed and evaluated these images and lab results as part of my medical decision-making.   EKG Interpretation None      MDM   Final diagnoses:  Injury, self-inflicted  Alcohol intoxication, uncomplicated ([place])    Disposition pending   Rolland Porter, MD, Barbette Or, MD 03/20/15 0700

## 2015-03-20 NOTE — ED Notes (Signed)
Repeat TTS being done. Pt refused his lunch tray

## 2015-03-20 NOTE — Progress Notes (Signed)
Psychoeducational Group Note  Date:  03/20/2015 Time:  2310  Group Topic/Focus:  Wrap-Up Group:   The focus of this group is to help patients review their daily goal of treatment and discuss progress on daily workbooks.  Participation Level: Did Not Attend  Participation Quality:  Not Applicable  Affect:  Not Applicable  Cognitive:  Not Applicable  Insight:  Not Applicable  Engagement in Group: Not Applicable  Additional Comments:  The patient did not attend group since he had yet to be admitted to the hallway.   Kimiyo Carmicheal S 03/20/2015, 11:10 PM

## 2015-03-20 NOTE — ED Notes (Signed)
Awaiting Sheriff's Dept officer for transfer.

## 2015-03-20 NOTE — ED Notes (Signed)
Pt escorted out of facility for transfer to [facility] in custody of sheriff's dept.

## 2015-03-20 NOTE — ED Notes (Signed)
ETOH consumption and self mutilation. IVC'd. History of previous mutilation attempts. Used rasor to cut his forehead tonight. Blew .45 for police. ETOH.

## 2015-03-20 NOTE — Consult Note (Signed)
Telepsych Consultation   Reason for Consult: Discharge Disposition Referring Physician:  [facility] EDP Patient Identification: [name] MRN:  [id number] Principal Diagnosis: Alcohol use disorder, severe, dependence ([place]) Diagnosis:   Patient Active Problem List   Diagnosis Date Noted  . Alcohol use disorder, severe, dependence ([place]) [F10.20] 03/20/2015    Total Time spent with patient: 30 minutes  Subjective:   [name] is a 24 y.o. male patient admitted with self inflicted cut and severe alcohol abuse.   HPI:    *NOTED: video down on tele-assessment at time of assessment. Clinician was unable see patient but could hear patient.* [name] is a 24 year old male who presented to the [facility] under IVC which was initiated by his mother. 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 His blood alcohol level on admission was 356. Patient very anxious to return home reporting he needed to be at work in the morning. Patient stated during assessment "I feel good. I need to get back to work tomorrow. I can't miss it. 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 I work all week long. I am safe to go home." Obtained collateral information from patient's mother [name] who reported the patient to be greatly minimizing his problems with alcohol reporting "He is going to die from alcohol poisoning. Last week he drank from Friday to the following Sunday. He hides alcohol in his room. He is lying about being a wrestler. He has not done that in five years. 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 The family member who gave him the job told him he can't work if he is drinking. My boy is out of control. He has already called me to have him some beer ready. I try to guard the house from the porch to stop beer from coming in. Six months ago he made a threat to hang himself but did not follow through. He is also supposed to take some thyroid  medicine but is not. 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   HPI Elements:   Location:  Alcohol abuse. Quality:  Severe intoxication with alcohol . Severity:  Severe. Timing:  Last few months. Duration:  Several years. Context:  Worsening alcohol abuse, stressors.  Past Medical History:  Past Medical History  Diagnosis Date  . Lymphoma ([place])   . Depression   . Thyroid disease   . Alcohol abuse     Past Surgical History  Procedure Laterality Date  . Appendectomy     Family History: History reviewed. No pertinent family history. Social History:  History  Alcohol Use  . Yes     History  Drug Use  . Yes    Comment: xanax    Social History   Social History  . Marital Status: Single    Spouse Name: N/A  . Number of Children: N/A  . Years of Education: N/A   Social History Main Topics  . Smoking status: Current Every Day Smoker  . Smokeless tobacco: None  . Alcohol Use: Yes  . Drug Use: Yes     Comment: xanax  . Sexual Activity: Yes   Other Topics Concern  . None   Social History Narrative   Additional Social History:    Pain Medications: none reported  Prescriptions: none reported Over the Counter: none  reported History of alcohol / drug use?: Yes (Patient denies any recent alcohol or drug use. Information obtained by UDS. ) Longest period of sobriety (when/how long): unk Name of Substance 1: benzos 1 - Age of First Use: unk 1 - Amount (size/oz): unk 1 - Frequency: unk 1 - Duration: unk 1 - Last Use / Amount: unk Name of Substance 2: etoh 2 - Age of First Use: unk 2 - Amount (size/oz): unk 2 - Frequency: unk 2 - Duration: unk 2 - Last Use / Amount: unk                 Allergies:   Allergies  Allergen Reactions  . Morphine And Related Itching    Labs:  Results for orders placed or performed during the hospital encounter of  03/20/15 (from the past 48 hour(s))  Urinalysis, Routine w reflex microscopic (not at [facility])     Status: Abnormal   Collection Time: 03/20/15  3:40 AM  Result Value Ref Range   Color, Urine YELLOW YELLOW   APPearance CLEAR CLEAR   Specific Gravity, Urine <1.005 (L) 1.005 - 1.030   pH 6.0 5.0 - 8.0   Glucose, UA NEGATIVE NEGATIVE mg/dL   Hgb urine dipstick MODERATE (A) NEGATIVE   Bilirubin Urine NEGATIVE NEGATIVE   Ketones, ur NEGATIVE NEGATIVE mg/dL   Protein, ur NEGATIVE NEGATIVE mg/dL   Urobilinogen, UA 0.2 0.0 - 1.0 mg/dL   Nitrite NEGATIVE NEGATIVE   Leukocytes, UA NEGATIVE NEGATIVE  Urine rapid drug screen (hosp performed)     Status: Abnormal   Collection Time: 03/20/15  3:40 AM  Result Value Ref Range   Opiates NONE DETECTED NONE DETECTED   Cocaine NONE DETECTED NONE DETECTED   Benzodiazepines POSITIVE (A) NONE DETECTED   Amphetamines NONE DETECTED NONE DETECTED   Tetrahydrocannabinol NONE DETECTED NONE DETECTED   Barbiturates NONE DETECTED NONE DETECTED    Comment:        DRUG SCREEN FOR MEDICAL PURPOSES ONLY.  IF CONFIRMATION IS NEEDED FOR ANY PURPOSE, NOTIFY LAB WITHIN 5 DAYS.        LOWEST DETECTABLE LIMITS FOR URINE DRUG SCREEN Drug Class       Cutoff (ng/mL) Amphetamine      1000 Barbiturate      200 Benzodiazepine   825 Tricyclics       053 Opiates          300 Cocaine          300 THC              50   Urine microscopic-add on     Status: None   Collection Time: 03/20/15  3:40 AM  Result Value Ref Range   Squamous Epithelial / LPF RARE RARE   WBC, UA 0-2 <3 WBC/hpf   RBC / HPF 0-2 <3 RBC/hpf   Bacteria, UA RARE RARE  Ethanol     Status: Abnormal   Collection Time: 03/20/15  4:27 AM  Result Value Ref Range   Alcohol, Ethyl (B) 356 (HH) <5 mg/dL    Comment:        LOWEST DETECTABLE LIMIT FOR SERUM ALCOHOL IS 5 mg/dL FOR MEDICAL PURPOSES ONLY CRITICAL RESULT CALLED TO, READ BACK BY AND VERIFIED WITH: PRUITT,G AT 0545 ON 03/20/2015 BY WOODS, M    Comprehensive metabolic panel     Status: Abnormal   Collection Time: 03/20/15  4:27 AM  Result Value Ref Range   Sodium 144 135 - 145 mmol/L  Potassium 4.5 3.5 - 5.1 mmol/L   Chloride 104 101 - 111 mmol/L   CO2 31 22 - 32 mmol/L   Glucose, Bld 106 (H) 65 - 99 mg/dL   BUN 6 6 - 20 mg/dL   Creatinine, Ser 0.81 0.61 - 1.24 mg/dL   Calcium 9.7 8.9 - 10.3 mg/dL   Total Protein 7.6 6.5 - 8.1 g/dL   Albumin 4.9 3.5 - 5.0 g/dL   AST 35 15 - 41 U/L   ALT 36 17 - 63 U/L   Alkaline Phosphatase 64 38 - 126 U/L   Total Bilirubin 0.3 0.3 - 1.2 mg/dL   GFR calc non Af Amer >60 >60 mL/min   GFR calc Af Amer >60 >60 mL/min    Comment: (NOTE) The eGFR has been calculated using the CKD EPI equation. This calculation has not been validated in all clinical situations. eGFR's persistently <60 mL/min signify possible Chronic Kidney Disease.    Anion gap 9 5 - 15  CBC with Differential     Status: Abnormal   Collection Time: 03/20/15  4:27 AM  Result Value Ref Range   WBC 7.1 4.0 - 10.5 K/uL   RBC 4.46 4.22 - 5.81 MIL/uL   Hemoglobin 15.3 13.0 - 17.0 g/dL   HCT 43.8 39.0 - 52.0 %   MCV 98.2 78.0 - 100.0 fL   MCH 34.3 (H) 26.0 - 34.0 pg   MCHC 34.9 30.0 - 36.0 g/dL   RDW 13.0 11.5 - 15.5 %   Platelets 212 150 - 400 K/uL   Neutrophils Relative % 51 %   Neutro Abs 3.7 1.7 - 7.7 K/uL   Lymphocytes Relative 36 %   Lymphs Abs 2.6 0.7 - 4.0 K/uL   Monocytes Relative 8 %   Monocytes Absolute 0.5 0.1 - 1.0 K/uL   Eosinophils Relative 4 %   Eosinophils Absolute 0.3 0.0 - 0.7 K/uL   Basophils Relative 1 %   Basophils Absolute 0.0 0.0 - 0.1 K/uL  Ethanol     Status: Abnormal   Collection Time: 03/20/15 10:46 AM  Result Value Ref Range   Alcohol, Ethyl (B) 205 (H) <5 mg/dL    Comment:        LOWEST DETECTABLE LIMIT FOR SERUM ALCOHOL IS 5 mg/dL FOR MEDICAL PURPOSES ONLY     Vitals: Blood pressure 113/71, pulse 91, temperature 98.3 F (36.8 C), temperature source Oral, resp. rate 20, height  5' 6"  (1.676 m), weight 54.432 kg (120 lb), SpO2 97 %.  Risk to Self: Suicidal Ideation: No Suicidal Intent: No Is patient at risk for suicide?: No Suicidal Plan?: No Access to Means: Yes Specify Access to Suicidal Means: cut self with razor What has been your use of drugs/alcohol within the last 12 months?: UDS shows + for benzos How many times?:  (unk) Other Self Harm Risks: yes Triggers for Past Attempts: Other (Comment) (patient reports its for wrestling) Intentional Self Injurious Behavior: Cutting Comment - Self Injurious Behavior: Patient cut forehead with razor Risk to Others: Homicidal Ideation: No Thoughts of Harm to Others: No Current Homicidal Intent: No Current Homicidal Plan: No Access to Homicidal Means: No Identified Victim: NA History of harm to others?: No Assessment of Violence: None Noted Violent Behavior Description: Patient cooperative at assessment. Does patient have access to weapons?: No Criminal Charges Pending?: No Does patient have a court date: No Prior Inpatient Therapy: Prior Inpatient Therapy: Yes Prior Therapy Dates: 2012, 2011, 2009 Prior Therapy Facilty/Provider(s): [facility] Reason for Treatment:  SA Prior Outpatient Therapy: Prior Outpatient Therapy: No Prior Therapy Dates: NA Prior Therapy Facilty/Provider(s): NA Reason for Treatment: NA Does patient have an ACCT team?: No Does patient have Intensive In-House Services?  : No Does patient have [facility] services? : No Does patient have P4CC services?: No  Current Facility-Administered Medications  Medication Dose Route Frequency Provider Last Rate Last Dose  . acetaminophen (TYLENOL) tablet 650 mg  650 mg Oral Q4H PRN Rolland Porter, MD      . alum & mag hydroxide-simeth (MAALOX/MYLANTA) 200-200-20 MG/5ML suspension 30 mL  30 mL Oral PRN Rolland Porter, MD      . ibuprofen (ADVIL,MOTRIN) tablet 600 mg  600 mg Oral Q8H PRN Rolland Porter, MD      . LORazepam (ATIVAN) tablet 0-4 mg  0-4 mg Oral 4 times per day  Rolland Porter, MD   1 mg at 03/20/15 1106   Followed by  . [START ON 03/22/2015] LORazepam (ATIVAN) tablet 0-4 mg  0-4 mg Oral Q12H Rolland Porter, MD      . nicotine (NICODERM CQ - dosed in mg/24 hours) patch 21 mg  21 mg Transdermal Daily Rolland Porter, MD   21 mg at 03/20/15 1057  . ondansetron (ZOFRAN) tablet 4 mg  4 mg Oral Q8H PRN Rolland Porter, MD      . thiamine (VITAMIN B-1) tablet 100 mg  100 mg Oral Daily Rolland Porter, MD   100 mg at 03/20/15 1104   Or  . thiamine (B-1) injection 100 mg  100 mg Intravenous Daily Rolland Porter, MD      . zolpidem (AMBIEN) tablet 10 mg  10 mg Oral QHS PRN Rolland Porter, MD       Current Outpatient Prescriptions  Medication Sig Dispense Refill  . acetaminophen (TYLENOL) 325 MG tablet Take 650 mg by mouth every 6 (six) hours as needed. For pain       Musculoskeletal: Unable to assess  Psychiatric Specialty Exam: Physical Exam  Review of Systems  Psychiatric/Behavioral: Positive for substance abuse. The patient is nervous/anxious.     Blood pressure 113/71, pulse 91, temperature 98.3 F (36.8 C), temperature source Oral, resp. rate 20, height 5' 6"  (1.676 m), weight 54.432 kg (120 lb), SpO2 97 %.Body mass index is 19.38 kg/(m^2).  General Appearance: Unable to assess due to video component not working   Engineer, water::  Unable to assess due to video not working of Public affairs consultant:  Clear and Coherent  Volume:  Normal  Mood:  Anxious  Affect:  Unable to assess  Thought Process:  Coherent and Goal Directed  Orientation:  Full (Time, Place, and Person)  Thought Content:  Rumination  Suicidal Thoughts:  No  Homicidal Thoughts:  No  Memory:  Immediate;   Good Recent;   Good Remote;   Good  Judgement:  Impaired  Insight:  Lacking  Psychomotor Activity:  Unable to assess  Concentration:  Good  Recall:  Good  Fund of Knowledge:Fair  Language: Good  Akathisia:  No  Handed:  Right  AIMS (if indicated):     Assets:  Communication  Skills Housing Intimacy Physical Health Resilience Social Support  ADL's:  Intact  Cognition: WNL  Sleep:      Medical Decision Making: Review of Psycho-Social Stressors (1), Review or order clinical lab tests (1) and New Problem, with no additional work-up planned (3)  Plan:  Recommend inpatient admission for crisis stabilization Disposition: Recommend inpatient treatment for the purposes of alcohol detox  and referral for substance abuse treatment.   Elmarie Shiley, NP-C 03/20/2015 1:46 PM

## 2015-03-20 NOTE — ED Notes (Signed)
Pt states he is ready to go home. Pt made aware that paper work was taken out on him last night to make him involuntary. Pt states when he cut his forehead it was suppose to look like he has been hit in the head with a chair ? Marland Kitchen He was not trying to commit suicide Pt's dad called and states pt needs to be sent off for a few weeks. Phone number to [facility] given to family so they could call and give their input

## 2015-03-20 NOTE — BH Assessment (Signed)
Clinician assessed patient. Clinician contacted Dr. Tomi Bamberger to discuss patient. Clinician consulted with Darlyne Russian, PA who recommends patient be reassessed later in the AM by psychiatry when BAL lowers.  Rigoberto Noel, MSW, LCSW Triage Specialist [phone],

## 2015-03-20 NOTE — ED Notes (Signed)
Pt moved to room #17.

## 2015-03-20 NOTE — ED Notes (Signed)
IVC paperwork being served

## 2015-03-21 ENCOUNTER — Encounter (HOSPITAL_COMMUNITY): Payer: Self-pay | Admitting: Psychiatry

## 2015-03-21 DIAGNOSIS — F1020 Alcohol dependence, uncomplicated: Principal | ICD-10-CM

## 2015-03-21 DIAGNOSIS — F1994 Other psychoactive substance use, unspecified with psychoactive substance-induced mood disorder: Secondary | ICD-10-CM | POA: Diagnosis present

## 2015-03-21 LAB — TSH: TSH: 15.236 u[IU]/mL — ABNORMAL HIGH (ref 0.350–4.500)

## 2015-03-21 MED ORDER — HYDROXYZINE HCL 50 MG PO TABS
50.0000 mg | ORAL_TABLET | Freq: Every day | ORAL | Status: DC
  Administered 2015-03-21: 50 mg via ORAL
  Filled 2015-03-21 (×3): qty 1

## 2015-03-21 MED ORDER — HYDROXYZINE HCL 25 MG PO TABS: 25.0000 mg | ORAL_TABLET | Freq: Four times a day (QID) | ORAL | Status: DC | PRN

## 2015-03-21 MED ORDER — NICOTINE 21 MG/24HR TD PT24
21.0000 mg | MEDICATED_PATCH | Freq: Every day | TRANSDERMAL | Status: DC
  Filled 2015-03-21 (×4): qty 1
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
## 2015-03-21 NOTE — Tx Team (Signed)
Initial Interdisciplinary Treatment Plan   PATIENT STRESSORS: Marital or family conflict Substance abuse   PATIENT STRENGTHS: Average or above average intelligence Capable of independent living   PROBLEM LIST: Problem List/Patient Goals Date to be addressed Date deferred Reason deferred Estimated date of resolution  Substance abuse (ETOH, benzos) 03/20/15     Depression 03/20/15     Suicidal Ideation 03/20/15     "I want to be discharged so I can go back to work. I've been here before and I know what I need to do"                                     DISCHARGE CRITERIA:  Improved stabilization in mood, thinking, and/or behavior Motivation to continue treatment in a less acute level of care Need for constant or close observation no longer present Withdrawal symptoms are absent or subacute and managed without 24-hour nursing intervention  PRELIMINARY DISCHARGE PLAN: Attend 12-step recovery group Participate in family therapy Return to previous living arrangement  PATIENT/FAMIILY INVOLVEMENT: This treatment plan has been presented to and reviewed with the patient, [name].  The patient and family have been given the opportunity to ask questions and make suggestions.  [name] 03/21/2015, 12:05 AM

## 2015-03-21 NOTE — BHH Suicide Risk Assessment (Signed)
[facility] Admission Suicide Risk Assessment   Nursing information obtained from:  Patient Demographic factors:  Male, Adolescent or young adult, Caucasian Current Mental Status:  Suicidal ideation indicated by others, Self-harm behaviors Loss Factors:   (Substance abuse) Historical Factors:  Impulsivity (Prior suicidal threats) Risk Reduction Factors:  Sense of responsibility to family, Religious beliefs about death, Employed, Living with another person, especially a relative Total Time spent with patient: 45 minutes Principal Problem: <principal problem not specified> Diagnosis:   Patient Active Problem List   Diagnosis Date Noted  . Alcohol use disorder, severe, dependence ([place]) [F10.20] 03/20/2015     Continued Clinical Symptoms:  Alcohol Use Disorder Identification Test Final Score (AUDIT): 14 The "Alcohol Use Disorders Identification Test", Guidelines for Use in Primary Care, Second Edition.  World Pharmacologist [facility]). Score between 0-7:  no or low risk or alcohol related problems. Score between 8-15:  moderate risk of alcohol related problems. Score between 16-19:  high risk of alcohol related problems. Score 20 or above:  warrants further diagnostic evaluation for alcohol dependence and treatment.   CLINICAL FACTORS:   Depression:   Comorbid alcohol abuse/dependence Alcohol/Substance Abuse/Dependencies  Psychiatric Specialty Exam: Physical Exam  ROS  Blood pressure 131/93, pulse 78, temperature 97.8 F (36.6 C), temperature source Oral, resp. rate 16, height 5\' 6"  (1.676 m), weight 51.71 kg (114 lb).Body mass index is 18.41 kg/(m^2).    COGNITIVE FEATURES THAT CONTRIBUTE TO RISK:  Closed-mindedness, Polarized thinking and Thought constriction (tunnel vision)    SUICIDE RISK:   Minimal: No identifiable suicidal ideation.  Patients presenting with no risk factors but with morbid ruminations; may be classified as minimal risk based on the severity of the depressive  symptoms  PLAN OF CARE: see Admission H and P  Medical Decision Making:  Review of Psycho-Social Stressors (1), Review or order clinical lab tests (1), Review of Medication Regimen & Side Effects (2) and Review of New Medication or Change in Dosage (2)  I certify that inpatient services furnished can reasonably be expected to improve the patient's condition.   Chalisa Kobler A 03/21/2015, 2:40 PM
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
## 2015-03-21 NOTE — BHH Group Notes (Signed)
[facility] LCSW Group Therapy 03/21/2015  1:15 PM   Type of Therapy: Group Therapy  Participation Level: Did Not Attend. Patient invited to participate but declined.   Tilden Fossa, MSW, [place] Worker [facility] [phone]

## 2015-03-21 NOTE — Progress Notes (Signed)
Adult Psychoeducational Group Note  Date:  03/21/2015 Time:  10:28 PM  Group Topic/Focus:  Wrap-Up Group:   The focus of this group is to help patients review their daily goal of treatment and discuss progress on daily workbooks.  Participation Level:  Did Not Attend  Additional Comments: patient didn't attend group but spoke with this writer about his goal to leave tomorrow because he has a good paying job he can't lose. Sande Rives H 03/21/2015, 10:28 PM

## 2015-03-21 NOTE — BHH Counselor (Signed)
Adult Comprehensive Assessment  Patient ID: POLO MCMARTIN, male   DOB: 1991/01/03, 24 y.o.   MRN: [id number]  Information Source: Information source: Patient  Current Stressors:  Educational / Learning stressors: N/A Employment / Job issues: Works full-time for a pressure washing/painting business Family Relationships: Reports that his family is supportive but that his alcohol abuse puts a strain on relationships, particularly with parents Museum/gallery curator / Lack of resources (include bankruptcy): Denies financial stressors Housing / Lack of housing: Lives with parents in [place] (include injuries & life threatening diseases): Thyroid issues, history of cancer (in remission since 20-Sep-2006) Social relationships: N/A Substance abuse: Reports alcohol abuse on the weekends- approximately 24 beers on average per weekend Bereavement / Loss: Grandfather died in 09/20/2006  Living/Environment/Situation:  Living Arrangements: Parent Living conditions (as described by patient or guardian): Lives with parents in [place]  How long has patient lived in current situation?: 16 years What is atmosphere in current home: Comfortable  Family History:  Marital status: Single Does patient have children?: No  Childhood History:  By whom was/is the patient raised?: Both parents Description of patient's relationship with caregiver when they were a child: Reports a good relationship with parents Patient's description of current relationship with people who raised him/her: Reports that his family is supportive but that his alcohol abuse puts a strain on relationships, particularly with parents Does patient have siblings?: Yes Number of Siblings: 1 Description of patient's current relationship with siblings: Has a good relationship with 1 sister who travels, reports that he misses her Did patient suffer any verbal/emotional/physical/sexual abuse as a child?: No Did patient suffer from severe childhood neglect?:  No Has patient ever been sexually abused/assaulted/raped as an adolescent or adult?: No Was the patient ever a victim of a crime or a disaster?: No Witnessed domestic violence?: No Has patient been effected by domestic violence as an adult?: No  Education:  Highest grade of school patient has completed: GED Currently a Ship broker?: No Learning disability?: No  Employment/Work Situation:   Employment situation: Employed Where is patient currently employed?: Works full-time for a pressure washing/painting business How long has patient been employed?: 4 months Patient's job has been impacted by current illness: No What is the longest time patient has a held a job?: Current position Where was the patient employed at that time?: Current company Has patient ever been in the [employer]?: No Has patient ever served in Recruitment consultant?: No  Financial Resources:   Financial resources: Income from employment Does patient have a representative payee or guardian?: No  Alcohol/Substance Abuse:   What has been your use of drugs/alcohol within the last 12 months?: Reports alcohol abuse on the weekends- approximately 24 beers on average per weekend If attempted suicide, did drugs/alcohol play a role in this?: No Alcohol/Substance Abuse Treatment Hx: Past Tx, Inpatient, Past Tx, Outpatient If yes, describe treatment: [place] 4-5 years ago, outpatient substance abuse treatment after [place] but cannot recall when or with whom Has alcohol/substance abuse ever caused legal problems?: Yes (Reports a past DUI several years ago and other charges related to alcohol intoxication)  Social Support System:   Patient's Community Support System: Fair Dietitian Support System: Family Type of faith/religion: Darrick Meigs How does patient's faith help to cope with current illness?: Finds prayer helpful  Leisure/Recreation:   Leisure and Hobbies: Audiological scientist, hiking, watching football  Strengths/Needs:   What  things does the patient do well?: Unable to answer In what areas does patient struggle /  problems for patient: Difficulty sleeping, alcohol abuse, anxiety, panic attacks  Discharge Plan:   Does patient have access to transportation?: Yes (Reports that family will provide transportation) Will patient be returning to same living situation after discharge?: Yes (Plans to return home with parents) Currently receiving community mental health services: No If no, would patient like referral for services when discharged?: Yes (What county?) (Agreeable to referral to [facility]) Does patient have financial barriers related to discharge medications?: Yes Patient description of barriers related to discharge medications: No health insurance  Summary/Recommendations:     Patient is a 24 year old Caucasian male IVC'd by mother for alcohol abuse. Patient lives in [place] with his parents and plans to return at discharge. He is agreeable to follow up with [facility] as he has been there in the past. Patient has identified his family as supportive. Patient will benefit from crisis stabilization, medication evaluation, group therapy, and psycho education in addition to case management for discharge planning. Patient and CSW reviewed pt's identified goals and treatment plan. Pt verbalized understanding and agreed to treatment plan.    Efren Kross, Casimiro Needle. 03/21/2015

## 2015-03-21 NOTE — Progress Notes (Addendum)
DAR Note: [name] has been in bed much of the day.  He reported earlier this morning that "I just don't feel well."  He states that he was nauseated this morning but was feeling better at lunch.  He did get up and go to the cafeteria for lunch but reported it made him feel worse.  He is planning on talking with the doctor.  His CIWA at 12 noon was 5.  No prn given but takes his standing librium.  Encouraged him to get up and attend some groups.  He appears to be in no physical distress and is reading quietly in his room.  No c/o of pain.  Q 15 minute checks maintained for safety.  Continued to encouraged participation in group and unit activities.

## 2015-03-21 NOTE — BHH Suicide Risk Assessment (Signed)
Ong INPATIENT:  Family/Significant Other Suicide Prevention Education  Suicide Prevention Education:  Education Completed; Mother Adonai Selsor [phone],  (name of family member/significant other) has been identified by the patient as the family member/significant other with whom the patient will be residing, and identified as the person(s) who will aid the patient in the event of a mental health crisis (suicidal ideations/suicide attempt).  With written consent from the patient, the family member/significant other has been provided the following suicide prevention education, prior to the and/or following the discharge of the patient.  The suicide prevention education provided includes the following:  Suicide risk factors  Suicide prevention and interventions  National Suicide Hotline telephone number  [facility] assessment telephone number  [facility] Emergency Assistance [place] and/or Residential Mobile Crisis Unit telephone number  Request made of family/significant other to:  Remove weapons (e.g., guns, rifles, knives), all items previously/currently identified as safety concern.    Remove drugs/medications (over-the-counter, prescriptions, illicit drugs), all items previously/currently identified as a safety concern.  The family member/significant other verbalizes understanding of the suicide prevention education information provided.  The family member/significant other agrees to remove the items of safety concern listed above.  Jemiah [place], Casimiro Needle 03/21/2015, 4:11 PM

## 2015-03-21 NOTE — Tx Team (Addendum)
Interdisciplinary Treatment Plan Update (Adult) Date: 03/21/2015    Time Reviewed: 9:30 AM  Progress in Treatment: Attending groups: Continuing to assess, patient new to milieu Participating in groups: Continuing to assess, patient new to milieu Taking medication as prescribed: Yes Tolerating medication: Yes Family/Significant other contact made: No, CSW assessing for appropriate contacts Patient understands diagnosis: Yes Discussing patient identified problems/goals with staff: Yes Medical problems stabilized or resolved: Yes Denies suicidal/homicidal ideation: Yes Issues/concerns per patient self-inventory: Yes Other:  New problem(s) identified: N/A  Discharge Plan or Barriers: Patient plans to return home with his parents at discharge to follow up with outpatient services.  Reason for Continuation of Hospitalization:  Depression Anxiety Medication Stabilization   Comments: N/A  Estimated length of stay: 2-3 days   Patient is a 24 year old Caucasian male IVC'd by patient's mother for alcohol abuse. Patient lives in [place] with his parents and plans to return at discharge. Patient will benefit from crisis stabilization, medication evaluation, group therapy, and psycho education in addition to case management for discharge planning. Patient and CSW reviewed pt's identified goals and treatment plan. Pt verbalized understanding and agreed to treatment plan.     Review of initial/current patient goals per problem list:  1. Goal(s): Patient will participate in aftercare plan   Met: Yes   Target date: 3-5 days post admission date   As evidenced by: Patient will participate within aftercare plan AEB aftercare provider and housing plan at discharge being identified. 10/10: Goal met: Patient plans to return home with parents to follow up with outpatient services at [facility].     2. Goal (s): Patient will exhibit decreased depressive symptoms and suicidal  ideations.   Met: Yes   Target date: 3-5 days post admission date   As evidenced by: Patient will utilize self rating of depression at 3 or below and demonstrate decreased signs of depression or be deemed stable for discharge by MD. 10/10: Goal met: Patient rates depressive symptoms low and denies SI, reports feeling safe for discharge.    4. Goal(s): Patient will demonstrate decreased signs of withdrawal due to substance abuse   Met: Adequate for discharge per MD   Target date: 3-5 days post admission date   As evidenced by: Patient will produce a CIWA/COWS score of 0, have stable vitals signs, and no symptoms of withdrawal 10/10: Goal not met: Pt continues to have withdrawal symptoms of anxiety and tremor and a CIWA score of a 7.  Pt to show decrease withdrawal symptoms prior to d/c. 10/11: Adequate for discharge: Patient reports improvement in symptoms and reports feeling safe for discharge.    Attendees: Patient:    Family:    Physician: Dr. Cobos; Dr. Lugo 03/21/2015 9:30 AM  Nursing: Jane Ochieng, Beverly Knight, RN 03/21/2015 9:30 AM  Clinical Social Worker: Kristin Drinkard, LCSWA 03/21/2015 9:30 AM  Other: Lauren Carter, Heather Smart LCSWA  03/21/2015 9:30 AM  Other: Valerie Enoch, [facility] Liaison 03/21/2015 9:30 AM  Other:  03/21/2015 9:30 AM  Other: Delora Sutton, P4CC 03/21/2015 9:30 AM  Other:          Scribe for Treatment Team:  Kristin Drinkard, MSW, LCSWA [phone]     

## 2015-03-21 NOTE — Progress Notes (Signed)
D: Patient alert and oriented x 4. Patient denies pain/SI/HI/AVH. Patient laceration on forehead in clean and dry and intact, no drainage observed.  Patient states, "I really hope I can go home tomorrow, I really don't want to miss work. My mom doesn't like drinking and that is why I am here."  A: Staff to monitor Q 15 mins for safety. Encouragement and support offered. Scheduled medications administered per orders. R: Patient remains safe on the unit. Patient attended group tonight. Patient visible on hte unit and interacting with peers. Patient taking administered medications.

## 2015-03-21 NOTE — BHH Group Notes (Signed)
[facility] LCSW Aftercare Discharge Planning Group Note  03/21/2015  8:45 AM  Participation Quality: Did Not Attend. Patient invited to participate but declined.  Tilden Fossa, MSW, [place] Worker [facility] [phone]

## 2015-03-22 MED ORDER — HYDROXYZINE HCL 50 MG PO TABS: 50.0000 mg | ORAL_TABLET | Freq: Every day | ORAL | Status: DC

## 2015-03-22 NOTE — BHH Suicide Risk Assessment (Addendum)
[facility] Discharge Suicide Risk Assessment   Demographic Factors:  Male and Caucasian  Total Time spent with patient: 30 minutes  Musculoskeletal: Strength & Muscle Tone: within normal limits Gait & Station: normal Patient leans: normal  Psychiatric Specialty Exam: Physical Exam  Review of Systems  Constitutional: Negative.   HENT: Negative.   Eyes: Negative.   Respiratory: Negative.   Cardiovascular: Negative.   Gastrointestinal: Positive for heartburn.  Genitourinary: Negative.   Musculoskeletal: Negative.   Skin: Negative.   Neurological: Negative.   Endo/Heme/Allergies: Negative.   Psychiatric/Behavioral: Positive for substance abuse.    Blood pressure 110/65, pulse 59, temperature 98.1 F (36.7 C), temperature source Oral, resp. rate 20, height 5\' 6"  (1.676 m), weight 51.71 kg (114 lb).Body mass index is 18.41 kg/(m^2).  General Appearance: Fairly Groomed  Engineer, water::  Fair  Speech:  Clear and [id number]  Volume:  Normal  Mood:  Euthymic  Affect:  Appropriate  Thought Process:  Coherent and Goal Directed  Orientation:  Full (Time, Place, and Person)  Thought Content:  plans as he moves on, relapse prevention plan  Suicidal Thoughts:  No  Homicidal Thoughts:  No  Memory:  Immediate;   Fair Recent;   Fair Remote;   Fair  Judgement:  Fair  Insight:  Present  Psychomotor Activity:  Normal  Concentration:  Fair  Recall:  [employer] [place]  Language: Fair  Akathisia:  No  Handed:  Right  AIMS (if indicated):     Assets:  Desire for Improvement Housing Vocational/Educational  Sleep:  Number of Hours: 6.5  Cognition: WNL  ADL's:  Intact   Have you used any form of tobacco in the last 30 days? (Cigarettes, Smokeless Tobacco, Cigars, and/or Pipes): Yes  Has this patient used any form of tobacco in the last 30 days? (Cigarettes, Smokeless Tobacco, Cigars, and/or Pipes) Yes, A prescription for an FDA-approved tobacco cessation medication was offered at  discharge and the patient refused  Mental Status Per Nursing Assessment::   On Admission:  Suicidal ideation indicated by others, Self-harm behaviors  Current Mental Status by Physician: in full contact with reality. There are no active S/S of withdrawal. There are no active SI plans or plans. He states he is committed to abstinence. He is planning to go to meetings and follow up outpatient basis. He was made aware of his high TSH level and will resume his synthroid once he gets home   Loss Factors: NA  Historical Factors: NA  Risk Reduction Factors:   Sense of responsibility to family, Employed, Living with another person, especially a relative and Positive social support  Continued Clinical Symptoms:  Alcohol/Substance Abuse/Dependencies  Cognitive Features That Contribute To Risk:  None    Suicide Risk:  Minimal: No identifiable suicidal ideation.  Patients presenting with no risk factors but with morbid ruminations; may be classified as minimal risk based on the severity of the depressive symptoms  Principal Problem: Substance induced mood disorder [facility]) Discharge Diagnoses:  Patient Active Problem List   Diagnosis Date Noted  . Substance induced mood disorder ([place]) [F19.94] 03/21/2015  . Alcohol use disorder, severe, dependence ([place]) [F10.20] 03/20/2015    Follow-up Information    Follow up with [facility] On 03/25/2015.   Why:  Assessment for therapy and medication management services on Friday Oct. 14th at 9 am. Please call if you need to reschedule.   Contact information:   [address] [place], [place] [address] Phone: [phone]  Plan Of Care/Follow-up recommendations:  Activity:  as tolerated Diet:  regular Follow up as above Resume your synthroid and follow up with your PCP Is patient on multiple antipsychotic therapies at discharge:  No   Has Patient had three or more failed trials of antipsychotic monotherapy by history:  No  Recommended Plan for  Multiple Antipsychotic Therapies: NA    Kataryna Mcquilkin A 03/22/2015, 9:46 AM

## 2015-03-22 NOTE — Progress Notes (Signed)
Pt. D/C from the unit to lobby accompanied by Mother.  He was pleasant and cooperative. He voiced no SI/HI or A/V halluciations.  He denies any pain or discomfort.  D/C instructions and medications reviewed with pt.  Pt. verbalized understanding of medications and d/c instructions.   All belongings (from locker #34-belt, shoes, jacket) returned to pt. Q 15 min checks maintained until discharge.  He left the unit in no apparent distress.

## 2015-03-22 NOTE — BHH Group Notes (Signed)
[facility] Group Notes:  (Nursing/MHT/Case Management/Adjunct)  Date:  03/22/2015  Time:  1:49 PM  Type of Therapy:  Nurse Education  Participation Level:  Did Not Attend  Participation Quality:    Affect:    Cognitive:    Insight:    Engagement in Group:    Modes of Intervention:  Discussion and Education  Summary of Progress/Problems:  Group topic was Recovery.  Discussed setting appropriate/measurable goals.  He did not attend group after much encouragement   Barbette Or Dynver Clemson 03/22/2015, 1:49 PM

## 2015-03-22 NOTE — Progress Notes (Signed)
  [facility] Adult Case Management Discharge Plan :  Will you be returning to the same living situation after discharge:  Yes,  Patient plans to return home at discharge At discharge, do you have transportation home?: Yes,  patient reports access to tranpsortation by family Do you have the ability to pay for your medications: Yes,  patient will be provided with any necessary prescriptions  Release of information consent forms completed and in the chart;  Patient's signature needed at discharge.  Patient to Follow up at: Follow-up Information    Follow up with [facility] On 03/25/2015.   Why:  Assessment for therapy and medication management services on Friday Oct. 14th at 9 am. Please call if you need to reschedule.   Contact information:   [address], [place] [address] Phone: [phone]      Patient denies SI/HI: Yes,  denies    Safety Planning and Suicide Prevention discussed: Yes,  with patient and mother  Have you used any form of tobacco in the last 30 days? (Cigarettes, Smokeless Tobacco, Cigars, and/or Pipes): Yes  Has patient been referred to the Quitline?: Patient refused referral  [name], Casimiro Needle 03/22/2015, 10:21 AM

## 2015-03-22 NOTE — Discharge Summary (Signed)
Physician Discharge Summary Note  Patient:  [name] is an 24 y.o., male MRN:  [id number] DOB:  [birth year] Patient phone:  [phone] (home)  Patient address:   [address] [place] [address],  Total Time spent with patient: 30 minutes  Date of Admission:  03/20/2015 Date of Discharge: 03/22/2015  Reason for Admission:  Drinking and superficial lacerations   Principal Problem: Substance induced mood disorder [facility]) Discharge Diagnoses: Patient Active Problem List   Diagnosis Date Noted  . Substance induced mood disorder ([name]) [F19.94] 03/21/2015  . Alcohol use disorder, severe, dependence ([name]) [F10.20] 03/20/2015    Musculoskeletal: Strength & Muscle Tone: within normal limits Gait & Station: normal Patient leans: N/A  Psychiatric Specialty Exam:  SEE MD SRA Physical Exam  Vitals reviewed. Psychiatric: His mood appears not anxious. Thought content is not paranoid. He does not exhibit a depressed mood. He expresses no homicidal and no suicidal ideation.    Review of Systems  All other systems reviewed and are negative.   Blood pressure 110/65, pulse 59, temperature 98.1 F (36.7 C), temperature source Oral, resp. rate 20, height 5\' 6"  (1.676 m), weight 51.71 kg (114 lb).Body mass index is 18.41 kg/(m^2).  Have you used any form of tobacco in the last 30 days? (Cigarettes, Smokeless Tobacco, Cigars, and/or Pipes): Yes  Has this patient used any form of tobacco in the last 30 days? (Cigarettes, Smokeless Tobacco, Cigars, and/or Pipes) N/A  Past Medical History:  Past Medical History  Diagnosis Date  . Lymphoma ([name])   . Depression   . Thyroid disease   . Alcohol abuse     Past Surgical History  Procedure Laterality Date  . Appendectomy     Family History: History reviewed. No pertinent family history. Social History:  History  Alcohol Use  . Yes     History  Drug Use  . Yes    Comment: xanax    Social History   Social History  . Marital Status:  Single    Spouse Name: N/A  . Number of Children: N/A  . Years of Education: N/A   Social History Main Topics  . Smoking status: Current Every Day Smoker  . Smokeless tobacco: None  . Alcohol Use: Yes  . Drug Use: Yes     Comment: xanax  . Sexual Activity: Yes   Other Topics Concern  . None   Social History Narrative   Risk to Self: Is patient at risk for suicide?: Yes What has been your use of drugs/alcohol within the last 12 months?: Reports alcohol abuse on the weekends- approximately 24 beers on average per weekend Risk to Others:   Prior Inpatient Therapy:   Prior Outpatient Therapy:    Level of Care:  OP  Hospital Course:  Christropher Gintz, a 21 Y/ male, was initially seen at [name].  He admitted that he had been drinking on a regular basis.  It was also reported that he had self inflicted lacerations on his forehead by a razor. 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 Patient UDS positive for Benzos and BAL 356 at 0427.  Patient was at [name] in 2012, 2011, 2009 due SA but patient denies previous inpatient.  [name] was admitted for Substance induced mood disorder ([name]) and crisis management.  He  was treated discharged with the medications listed below under Medication List.  Medical problems were identified and treated as needed.  Home medications were restarted as appropriate.  Improvement was monitored by observation and Marland Kitchen daily report of symptom reduction.  Emotional and mental status was monitored by daily self-inventory reports completed by Marland Kitchen and clinical staff.         [name] was evaluated by the treatment team for stability and plans for continued recovery upon discharge.  Marland Kitchen motivation was an integral factor for scheduling further treatment.   Employment, transportation, bed availability, health status, family support, and any pending legal issues were also considered during his hospital stay.  He was offered further treatment options upon discharge including but not limited to Residential, Intensive Outpatient, and Outpatient treatment.  [name] will follow up with the services as listed below under Follow Up Information.     Upon completion of this admission the patient was both mentally and medically stable for discharge denying suicidal/homicidal ideation, auditory/visual/tactile hallucinations, delusional thoughts and paranoia.      Consults:  psychiatry  Significant Diagnostic Studies:  labs: per ED  Discharge Vitals:   Blood pressure 110/65, pulse 59, temperature 98.1 F (36.7 C), temperature source Oral, resp. rate 20, height 5\' 6"  (1.676 m), weight 51.71 kg (114 lb). Body mass index is 18.41 kg/(m^2). Lab Results:   Results for orders placed or performed during the hospital encounter of 03/20/15 (from the past 72 hour(s))  TSH     Status: Abnormal   Collection Time: 03/21/15  6:07 PM  Result Value Ref Range   TSH 15.236 (H) 0.350 - 4.500 uIU/mL    Comment: Performed at [facility]    Physical Findings: AIMS: Facial and Oral Movements Muscles of Facial Expression: None, normal Lips and Perioral Area: None, normal Jaw: None, normal Tongue: None, normal,Extremity Movements Upper (arms, wrists, hands, fingers): None, normal Lower (legs, knees, ankles, toes): None, normal, Trunk Movements Neck, shoulders, hips: None, normal, Overall Severity Severity of abnormal movements (highest score from questions above): None, normal Incapacitation due to abnormal movements: None, normal Patient's awareness of abnormal movements (rate only patient's report): No Awareness, Dental Status Current problems with teeth and/or dentures?: No Does patient usually wear dentures?: No  CIWA:  CIWA-Ar Total:  2 COWS:      See Psychiatric Specialty Exam and Suicide Risk Assessment completed by Attending Physician prior to discharge.  Discharge destination:  Home  Is patient on multiple antipsychotic therapies at discharge:  No   Has Patient had three or more failed trials of antipsychotic monotherapy by history:  No    Recommended Plan for Multiple Antipsychotic Therapies: NA     Medication List    STOP taking these medications        acetaminophen 325 MG tablet  Commonly known as:  TYLENOL      TAKE these medications      Indication   hydrOXYzine 50 MG tablet  Commonly known as:  ATARAX/VISTARIL  Take 1 tablet (50 mg total) by mouth at bedtime.   Indication:  Anxiety Neurosis, Sedation           Follow-up Information    Follow up with [facility] On 03/25/2015.   Why:  Assessment for therapy and medication management services on Friday Oct. 14th at 9 am. Please call if you need to reschedule.   Contact information:   [address], [place]  [place] Phone: [phone]      Follow-up recommendations:  Activity:  as tol Diet:  as tol  Comments:  1.  Take all your medications as prescribed.              2.  Report any adverse side effects to outpatient provider.                       3.  Patient instructed to not use alcohol or illegal drugs while on prescription medicines.            4.  In the event of worsening symptoms, instructed patient to call 911, the crisis hotline or go to nearest emergency room for evaluation of symptoms.  Total Discharge Time:  30 min  Signed: Freda Munro May Agustin AGNP-BC 03/22/2015, 1:29 PM  I personally assessed the patient and formulated the plan Geralyn Flash A. Sabra Heck, M.D.

## 2023-04-23 ENCOUNTER — Ambulatory Visit (HOSPITAL_BASED_OUTPATIENT_CLINIC_OR_DEPARTMENT_OTHER): Payer: Self-pay | Admitting: Family Medicine

## 2023-05-21 ENCOUNTER — Ambulatory Visit: Payer: Commercial Managed Care - HMO | Admitting: Nurse Practitioner

## 2023-09-18 ENCOUNTER — Inpatient Hospital Stay (HOSPITAL_COMMUNITY)
Admission: EM | Admit: 2023-09-18 | Discharge: 2023-09-21 | DRG: 897 | Disposition: A | Discharge status: Home / Self Care | Payer: MEDICAID | Attending: Internal Medicine | Admitting: Internal Medicine

## 2023-09-18 ENCOUNTER — Other Ambulatory Visit: Payer: Self-pay

## 2023-09-18 DIAGNOSIS — Y900 Blood alcohol level of less than 20 mg/100 ml: Secondary | ICD-10-CM | POA: Diagnosis present

## 2023-09-18 DIAGNOSIS — G4089 Other seizures: Secondary | ICD-10-CM | POA: Diagnosis present

## 2023-09-18 DIAGNOSIS — R748 Abnormal levels of other serum enzymes: Secondary | ICD-10-CM | POA: Diagnosis present

## 2023-09-18 DIAGNOSIS — F32A Depression, unspecified: Secondary | ICD-10-CM | POA: Diagnosis present

## 2023-09-18 DIAGNOSIS — E890 Postprocedural hypothyroidism: Secondary | ICD-10-CM | POA: Diagnosis present

## 2023-09-18 DIAGNOSIS — R7401 Elevation of levels of liver transaminase levels: Secondary | ICD-10-CM | POA: Insufficient documentation

## 2023-09-18 DIAGNOSIS — C819A Hodgkin lymphoma, unspecified, in remission: Secondary | ICD-10-CM | POA: Diagnosis present

## 2023-09-18 DIAGNOSIS — E039 Hypothyroidism, unspecified: Secondary | ICD-10-CM

## 2023-09-18 DIAGNOSIS — R7989 Other specified abnormal findings of blood chemistry: Secondary | ICD-10-CM | POA: Insufficient documentation

## 2023-09-18 DIAGNOSIS — F10139 Alcohol abuse with withdrawal, unspecified: Principal | ICD-10-CM | POA: Diagnosis present

## 2023-09-18 DIAGNOSIS — D539 Nutritional anemia, unspecified: Secondary | ICD-10-CM | POA: Diagnosis present

## 2023-09-18 DIAGNOSIS — R569 Unspecified convulsions: Secondary | ICD-10-CM

## 2023-09-18 DIAGNOSIS — Z1152 Encounter for screening for COVID-19: Secondary | ICD-10-CM

## 2023-09-18 DIAGNOSIS — Z79890 Hormone replacement therapy (postmenopausal): Secondary | ICD-10-CM

## 2023-09-18 DIAGNOSIS — E860 Dehydration: Principal | ICD-10-CM | POA: Diagnosis present

## 2023-09-18 DIAGNOSIS — R739 Hyperglycemia, unspecified: Secondary | ICD-10-CM | POA: Diagnosis present

## 2023-09-18 DIAGNOSIS — F17200 Nicotine dependence, unspecified, uncomplicated: Secondary | ICD-10-CM | POA: Diagnosis present

## 2023-09-18 LAB — CBC WITH DIFFERENTIAL/PLATELET
Abs Immature Granulocytes: 0.08 10*3/uL — ABNORMAL HIGH (ref 0.00–0.07)
Basophils Absolute: 0.1 10*3/uL (ref 0.0–0.1)
Basophils Relative: 1 %
Eosinophils Absolute: 0 10*3/uL (ref 0.0–0.5)
Eosinophils Relative: 0 %
HCT: 34.3 % — ABNORMAL LOW (ref 39.0–52.0)
Hemoglobin: 11.8 g/dL — ABNORMAL LOW (ref 13.0–17.0)
Immature Granulocytes: 1 %
Lymphocytes Relative: 7 %
Lymphs Abs: 0.4 10*3/uL — ABNORMAL LOW (ref 0.7–4.0)
MCH: 34.7 pg — ABNORMAL HIGH (ref 26.0–34.0)
MCHC: 34.4 g/dL (ref 30.0–36.0)
MCV: 100.9 fL — ABNORMAL HIGH (ref 80.0–100.0)
Monocytes Absolute: 0.6 10*3/uL (ref 0.1–1.0)
Monocytes Relative: 9 %
Neutro Abs: 5.3 10*3/uL (ref 1.7–7.7)
Neutrophils Relative %: 82 %
Platelets: 152 10*3/uL (ref 150–400)
RBC: 3.4 MIL/uL — ABNORMAL LOW (ref 4.22–5.81)
RDW: 13.2 % (ref 11.5–15.5)
WBC: 6.4 10*3/uL (ref 4.0–10.5)
nRBC: 0 % (ref 0.0–0.2)

## 2023-09-18 LAB — RESP PANEL BY RT-PCR (RSV, FLU A&B, COVID)  RVPGX2
Influenza A by PCR: NEGATIVE
Influenza B by PCR: NEGATIVE
Resp Syncytial Virus by PCR: NEGATIVE
SARS Coronavirus 2 by RT PCR: NEGATIVE

## 2023-09-18 LAB — COMPREHENSIVE METABOLIC PANEL WITH GFR
ALT: 120 U/L — ABNORMAL HIGH (ref 0–44)
AST: 182 U/L — ABNORMAL HIGH (ref 15–41)
Albumin: 5.2 g/dL — ABNORMAL HIGH (ref 3.5–5.0)
Alkaline Phosphatase: 116 U/L (ref 38–126)
Anion gap: 19 — ABNORMAL HIGH (ref 5–15)
BUN: 6 mg/dL (ref 6–20)
CO2: 22 mmol/L (ref 22–32)
Calcium: 10 mg/dL (ref 8.9–10.3)
Chloride: 92 mmol/L — ABNORMAL LOW (ref 98–111)
Creatinine, Ser: 0.7 mg/dL (ref 0.61–1.24)
GFR, Estimated: >60 mL/min (ref >60)
Glucose, Bld: 151 mg/dL — ABNORMAL HIGH (ref 70–99)
Potassium: 3.8 mmol/L (ref 3.5–5.1)
Sodium: 133 mmol/L — ABNORMAL LOW (ref 135–145)
Total Bilirubin: 1 mg/dL (ref 0.0–1.2)
Total Protein: 8.1 g/dL (ref 6.5–8.1)

## 2023-09-18 LAB — TSH: TSH: 22.909 u[IU]/mL — ABNORMAL HIGH (ref 0.350–4.500)

## 2023-09-18 LAB — LIPASE, BLOOD: Lipase: 28 U/L (ref 11–51)

## 2023-09-18 LAB — ETHANOL: Alcohol, Ethyl (B): <10 mg/dL (ref <10)

## 2023-09-18 MED ORDER — LORAZEPAM 2 MG/ML IJ SOLN
1.0000 mg | Freq: Once | INTRAMUSCULAR | Status: AC
  Administered 2023-09-18: 1 mg via INTRAVENOUS
  Filled 2023-09-18: qty 1

## 2023-09-18 MED ORDER — SODIUM CHLORIDE 0.9 % IV BOLUS
1000.0000 mL | Freq: Once | INTRAVENOUS | Status: AC
  Administered 2023-09-18: 1000 mL via INTRAVENOUS

## 2023-09-18 MED ORDER — ONDANSETRON HCL 4 MG/2ML IJ SOLN
4.0000 mg | Freq: Once | INTRAMUSCULAR | Status: AC
  Administered 2023-09-18: 4 mg via INTRAVENOUS
  Filled 2023-09-18: qty 2

## 2023-09-18 NOTE — ED Triage Notes (Addendum)
 Pt bib [facility]. Per EMS pt has been drinking x5 days, pts mother called 911 stating pt was having "seizure like" activity lasting for aprrox. 5 mins. EMS reports upon their arrival pt was alert and oriented.     Pt states he has had 2 alcoholic beverage today, just prior to EMS arrival but that is significantly less than he normally has.

## 2023-09-18 NOTE — ED Provider Notes (Signed)
  EMERGENCY DEPARTMENT AT [facility] Provider Note   CSN: [id number] Arrival date & time: 09/18/23  2136     History {Add pertinent medical, surgical, social history, OB history to HPI:1} No chief complaint on file.   [name] is a 33 y.o. male.  Pt is a 33 yo male with pmhx significant for etoh abuse, Hodgkin's lymphoma (remission), depression, and hypothyroidism.  Pt said he's not been feeling well for the past few days.  He drinks heavily daily, but only drank 2 beers today due to not feeling well.  His mom witnessed "seizure like movements," lasting about 5 minutes.  Pt was alert when EMS arrived.  Pt said he's had a cough and has been vomiting.       Home Medications Prior to Admission medications   Medication Sig Start Date End Date Taking? Authorizing Provider  hydrOXYzine (ATARAX/VISTARIL) 50 MG tablet Take 1 tablet (50 mg total) by mouth at bedtime. 03/22/15   Adonis Brook, NP      Allergies    Morphine and codeine    Review of Systems   Review of Systems  Respiratory:  Positive for cough.   Gastrointestinal:  Positive for nausea and vomiting.  Neurological:  Positive for seizures.  All other systems reviewed and are negative.   Physical Exam Updated Vital Signs BP (!) 116/99 (BP Location: Right Arm)   Pulse (!) 115   Temp 98.1 F (36.7 C)   SpO2 95%  Physical Exam Vitals and nursing note reviewed.  Constitutional:      Appearance: He is ill-appearing.  HENT:     Head: Normocephalic and atraumatic.     Right Ear: External ear normal.     Left Ear: External ear normal.     Nose: Nose normal.     Mouth/Throat:     Mouth: Mucous membranes are dry.  Eyes:     Extraocular Movements: Extraocular movements intact.     Conjunctiva/sclera: Conjunctivae normal.     Pupils: Pupils are equal, round, and reactive to light.  Cardiovascular:     Rate and Rhythm: Regular rhythm. Tachycardia present.     Pulses: Normal pulses.      Heart sounds: Normal heart sounds.  Pulmonary:     Effort: Pulmonary effort is normal.     Breath sounds: Normal breath sounds.  Abdominal:     General: Abdomen is flat. Bowel sounds are normal.     Palpations: Abdomen is soft.     Tenderness: There is abdominal tenderness in the epigastric area.  Musculoskeletal:        General: Normal range of motion.     Cervical back: Normal range of motion and neck supple.  Skin:    General: Skin is warm.     Capillary Refill: Capillary refill takes less than 2 seconds.  Neurological:     General: No focal deficit present.     Mental Status: He is alert and oriented to person, place, and time.  Psychiatric:        Mood and Affect: Mood is anxious.     ED Results / Procedures / Treatments   Labs (all labs ordered are listed, but only abnormal results are displayed) Labs Reviewed  RESP PANEL BY RT-PCR (RSV, FLU A&B, COVID)  RVPGX2  CBC WITH DIFFERENTIAL/PLATELET  COMPREHENSIVE METABOLIC PANEL WITH GFR  LIPASE, BLOOD  URINALYSIS, ROUTINE W REFLEX MICROSCOPIC  ETHANOL  RAPID URINE DRUG SCREEN, HOSP PERFORMED  TSH  EKG None  Radiology No results found.  Procedures Procedures  {Document cardiac monitor, telemetry assessment procedure when appropriate:1}  Medications Ordered in ED Medications  sodium chloride 0.9 % bolus 1,000 mL (1,000 mLs Intravenous New Bag/Given 09/18/23 2210)  ondansetron (ZOFRAN) injection 4 mg (4 mg Intravenous Given 09/18/23 2210)  LORazepam (ATIVAN) injection 1 mg (1 mg Intravenous Given 09/18/23 2210)    ED Course/ Medical Decision Making/ A&P   {   Click here for ABCD2, HEART and other calculatorsREFRESH Note before signing :1}                              Medical Decision Making Amount and/or Complexity of Data Reviewed Labs: ordered.  Risk Prescription drug management.   This patient presents to the ED for concern of seizure, this involves an extensive number of treatment options, and is a  complaint that carries with it a high risk of complications and morbidity.  The differential diagnosis includes seizure, electrolyte abn, alcohol w/dr   Co morbidities that complicate the patient evaluation  etoh abuse, Hodgkin's lymphoma (remission), depression, and hypothyroidism   Additional history obtained:  Additional history obtained from epic chart review External records from outside source obtained and reviewed including EMS report   Lab Tests:  I Ordered, and personally interpreted labs.  The pertinent results include:  ***   Imaging Studies ordered:  I ordered imaging studies including ***  I independently visualized and interpreted imaging which showed *** I agree with the radiologist interpretation   Cardiac Monitoring:  The patient was maintained on a cardiac monitor.  I personally viewed and interpreted the cardiac monitored which showed an underlying rhythm of: ***   Medicines ordered and prescription drug management:  I ordered medication including ivfs/zofran/ativan  for sx  Reevaluation of the patient after these medicines showed that the patient improved I have reviewed the patients home medicines and have made adjustments as needed   Test Considered:  ***   Critical Interventions:  ***   Consultations Obtained:  I requested consultation with the ***,  and discussed lab and imaging findings as well as pertinent plan - they recommend: ***   Problem List / ED Course:  ***   Reevaluation:  After the interventions noted above, I reevaluated the patient and found that they have :improved   Social Determinants of Health:  Lives at home   Dispostion:  After consideration of the diagnostic results and the patients response to treatment, I feel that the patent would benefit from ***.    {Document critical care time when appropriate:1} {Document review of labs and clinical decision tools ie heart score, Chads2Vasc2 etc:1}  {Document  your independent review of radiology images, and any outside records:1} {Document your discussion with family members, caretakers, and with consultants:1} {Document social determinants of health affecting pt's care:1} {Document your decision making why or why not admission, treatments were needed:1} Final Clinical Impression(s) / ED Diagnoses Final diagnoses:  None    Rx / DC Orders ED Discharge Orders     None

## 2023-09-18 NOTE — ED Notes (Signed)
 Pt unable to void at this time.

## 2023-09-19 ENCOUNTER — Emergency Department (HOSPITAL_COMMUNITY): Payer: MEDICAID

## 2023-09-19 ENCOUNTER — Encounter (HOSPITAL_COMMUNITY): Payer: Self-pay | Admitting: Radiology

## 2023-09-19 ENCOUNTER — Inpatient Hospital Stay (HOSPITAL_COMMUNITY)
Admit: 2023-09-19 | Discharge: 2023-09-19 | Disposition: A | Discharge status: Home / Self Care | Payer: MEDICAID | Attending: Emergency Medicine | Admitting: Emergency Medicine

## 2023-09-19 DIAGNOSIS — R7989 Other specified abnormal findings of blood chemistry: Secondary | ICD-10-CM | POA: Insufficient documentation

## 2023-09-19 DIAGNOSIS — Y900 Blood alcohol level of less than 20 mg/100 ml: Secondary | ICD-10-CM | POA: Diagnosis present

## 2023-09-19 DIAGNOSIS — R569 Unspecified convulsions: Secondary | ICD-10-CM

## 2023-09-19 DIAGNOSIS — D539 Nutritional anemia, unspecified: Secondary | ICD-10-CM | POA: Diagnosis present

## 2023-09-19 DIAGNOSIS — F17200 Nicotine dependence, unspecified, uncomplicated: Secondary | ICD-10-CM | POA: Diagnosis present

## 2023-09-19 DIAGNOSIS — E860 Dehydration: Secondary | ICD-10-CM | POA: Diagnosis present

## 2023-09-19 DIAGNOSIS — R7401 Elevation of levels of liver transaminase levels: Secondary | ICD-10-CM | POA: Diagnosis not present

## 2023-09-19 DIAGNOSIS — C819A Hodgkin lymphoma, unspecified, in remission: Secondary | ICD-10-CM | POA: Diagnosis present

## 2023-09-19 DIAGNOSIS — F10139 Alcohol abuse with withdrawal, unspecified: Secondary | ICD-10-CM | POA: Diagnosis present

## 2023-09-19 DIAGNOSIS — F32A Depression, unspecified: Secondary | ICD-10-CM | POA: Diagnosis present

## 2023-09-19 DIAGNOSIS — G4089 Other seizures: Secondary | ICD-10-CM | POA: Diagnosis present

## 2023-09-19 DIAGNOSIS — E890 Postprocedural hypothyroidism: Secondary | ICD-10-CM | POA: Diagnosis present

## 2023-09-19 DIAGNOSIS — F10939 Alcohol use, unspecified with withdrawal, unspecified: Secondary | ICD-10-CM

## 2023-09-19 DIAGNOSIS — F10930 Alcohol use, unspecified with withdrawal, uncomplicated: Secondary | ICD-10-CM

## 2023-09-19 DIAGNOSIS — E039 Hypothyroidism, unspecified: Secondary | ICD-10-CM | POA: Diagnosis not present

## 2023-09-19 DIAGNOSIS — R748 Abnormal levels of other serum enzymes: Secondary | ICD-10-CM | POA: Diagnosis present

## 2023-09-19 DIAGNOSIS — Z79890 Hormone replacement therapy (postmenopausal): Secondary | ICD-10-CM | POA: Diagnosis not present

## 2023-09-19 DIAGNOSIS — Z1152 Encounter for screening for COVID-19: Secondary | ICD-10-CM | POA: Diagnosis not present

## 2023-09-19 DIAGNOSIS — R739 Hyperglycemia, unspecified: Secondary | ICD-10-CM

## 2023-09-19 LAB — COMPREHENSIVE METABOLIC PANEL WITH GFR
ALT: 90 U/L — ABNORMAL HIGH (ref 0–44)
AST: 123 U/L — ABNORMAL HIGH (ref 15–41)
Albumin: 4.2 g/dL (ref 3.5–5.0)
Alkaline Phosphatase: 91 U/L (ref 38–126)
Anion gap: 13 (ref 5–15)
BUN: 6 mg/dL (ref 6–20)
CO2: 23 mmol/L (ref 22–32)
Calcium: 9 mg/dL (ref 8.9–10.3)
Chloride: 96 mmol/L — ABNORMAL LOW (ref 98–111)
Creatinine, Ser: 0.65 mg/dL (ref 0.61–1.24)
GFR, Estimated: >60 mL/min (ref >60)
Glucose, Bld: 80 mg/dL (ref 70–99)
Potassium: 4.1 mmol/L (ref 3.5–5.1)
Sodium: 132 mmol/L — ABNORMAL LOW (ref 135–145)
Total Bilirubin: 1.4 mg/dL — ABNORMAL HIGH (ref 0.0–1.2)
Total Protein: 6.9 g/dL (ref 6.5–8.1)

## 2023-09-19 LAB — URINALYSIS, ROUTINE W REFLEX MICROSCOPIC
Bilirubin Urine: NEGATIVE
Glucose, UA: NEGATIVE mg/dL
Hgb urine dipstick: NEGATIVE
Ketones, ur: 20 mg/dL — AB
Leukocytes,Ua: NEGATIVE
Nitrite: NEGATIVE
Protein, ur: 100 mg/dL — AB
Specific Gravity, Urine: 1.011 (ref 1.005–1.030)
pH: 5 (ref 5.0–8.0)

## 2023-09-19 LAB — RAPID URINE DRUG SCREEN, HOSP PERFORMED
Amphetamines: NOT DETECTED
Barbiturates: NOT DETECTED
Benzodiazepines: POSITIVE — AB
Cocaine: NOT DETECTED
Opiates: NOT DETECTED
Tetrahydrocannabinol: NOT DETECTED

## 2023-09-19 LAB — CBC
HCT: 31 % — ABNORMAL LOW (ref 39.0–52.0)
Hemoglobin: 10.5 g/dL — ABNORMAL LOW (ref 13.0–17.0)
MCH: 34.9 pg — ABNORMAL HIGH (ref 26.0–34.0)
MCHC: 33.9 g/dL (ref 30.0–36.0)
MCV: 103 fL — ABNORMAL HIGH (ref 80.0–100.0)
Platelets: 117 10*3/uL — ABNORMAL LOW (ref 150–400)
RBC: 3.01 MIL/uL — ABNORMAL LOW (ref 4.22–5.81)
RDW: 13.2 % (ref 11.5–15.5)
WBC: 4.2 10*3/uL (ref 4.0–10.5)
nRBC: 0 % (ref 0.0–0.2)

## 2023-09-19 LAB — GLUCOSE, CAPILLARY
Glucose-Capillary: 101 mg/dL — ABNORMAL HIGH (ref 70–99)
Glucose-Capillary: 123 mg/dL — ABNORMAL HIGH (ref 70–99)
Glucose-Capillary: 71 mg/dL (ref 70–99)
Glucose-Capillary: 76 mg/dL (ref 70–99)
Glucose-Capillary: 91 mg/dL (ref 70–99)

## 2023-09-19 LAB — VITAMIN B12: Vitamin B-12: 330 pg/mL (ref 180–914)

## 2023-09-19 LAB — MRSA NEXT GEN BY PCR, NASAL: MRSA by PCR Next Gen: DETECTED — AB

## 2023-09-19 LAB — HIV ANTIBODY (ROUTINE TESTING W REFLEX): HIV Screen 4th Generation wRfx: NONREACTIVE

## 2023-09-19 LAB — T4, FREE: Free T4: 0.49 ng/dL — ABNORMAL LOW (ref 0.61–1.12)

## 2023-09-19 LAB — MAGNESIUM
Magnesium: 2.3 mg/dL (ref 1.7–2.4)
Magnesium: 2.2 mg/dL (ref 1.7–2.4)

## 2023-09-19 LAB — FOLATE: Folate: 8.1 ng/mL (ref >5.9)

## 2023-09-19 LAB — PHOSPHORUS: Phosphorus: 3.3 mg/dL (ref 2.5–4.6)

## 2023-09-19 MED ORDER — THIAMINE MONONITRATE 100 MG PO TABS
100.0000 mg | ORAL_TABLET | Freq: Every day | ORAL | Status: DC
  Administered 2023-09-19 – 2023-09-21 (×3): 100 mg via ORAL
  Filled 2023-09-19 (×3): qty 1

## 2023-09-19 MED ORDER — SODIUM CHLORIDE 0.9 % IV BOLUS
1000.0000 mL | Freq: Once | INTRAVENOUS | Status: AC
  Administered 2023-09-19: 1000 mL via INTRAVENOUS

## 2023-09-19 MED ORDER — CHLORHEXIDINE GLUCONATE CLOTH 2 % EX PADS
6.0000 | MEDICATED_PAD | Freq: Every day | CUTANEOUS | Status: DC
  Administered 2023-09-19: 6 via TOPICAL

## 2023-09-19 MED ORDER — LORAZEPAM 2 MG/ML IJ SOLN
INTRAMUSCULAR | Status: AC
Start: 2023-09-19 — End: 2023-09-19
  Filled 2023-09-19: qty 1

## 2023-09-19 MED ORDER — LACTATED RINGERS IV SOLN: INTRAVENOUS | Status: DC

## 2023-09-19 MED ORDER — LORAZEPAM 1 MG PO TABS
1.0000 mg | ORAL_TABLET | ORAL | Status: DC | PRN
  Administered 2023-09-19 – 2023-09-20 (×4): 1 mg via ORAL
  Administered 2023-09-21: 2 mg via ORAL
  Filled 2023-09-19 (×6): qty 1

## 2023-09-19 MED ORDER — LORAZEPAM 2 MG/ML IJ SOLN
1.0000 mg | Freq: Once | INTRAMUSCULAR | Status: AC
Start: 2023-09-19 — End: 2023-09-19
  Administered 2023-09-19: 1 mg via INTRAVENOUS

## 2023-09-19 MED ORDER — NICOTINE 21 MG/24HR TD PT24
21.0000 mg | MEDICATED_PATCH | Freq: Every day | TRANSDERMAL | Status: DC
  Administered 2023-09-19 – 2023-09-21 (×3): 21 mg via TRANSDERMAL
  Filled 2023-09-19 (×3): qty 1

## 2023-09-19 MED ORDER — THIAMINE HCL 100 MG/ML IJ SOLN
100.0000 mg | Freq: Every day | INTRAMUSCULAR | Status: DC
Start: 2023-09-19 — End: 2023-09-21

## 2023-09-19 MED ORDER — ADULT MULTIVITAMIN W/MINERALS CH
1.0000 | ORAL_TABLET | Freq: Every day | ORAL | Status: DC
  Administered 2023-09-19 – 2023-09-21 (×3): 1 via ORAL
  Filled 2023-09-19 (×3): qty 1

## 2023-09-19 MED ORDER — CHLORDIAZEPOXIDE HCL 5 MG PO CAPS
5.0000 mg | ORAL_CAPSULE | Freq: Four times a day (QID) | ORAL | Status: DC | PRN
  Administered 2023-09-19 – 2023-09-20 (×4): 5 mg via ORAL
  Filled 2023-09-19 (×4): qty 1

## 2023-09-19 MED ORDER — LACTATED RINGERS IV SOLN: INTRAVENOUS | Status: AC

## 2023-09-19 MED ORDER — MUPIROCIN 2 % EX OINT
TOPICAL_OINTMENT | Freq: Two times a day (BID) | CUTANEOUS | Status: DC
  Administered 2023-09-19 – 2023-09-21 (×2): 1 via NASAL
  Filled 2023-09-19: qty 22

## 2023-09-19 MED ORDER — LORAZEPAM 2 MG/ML IJ SOLN
1.0000 mg | INTRAMUSCULAR | Status: DC | PRN
  Administered 2023-09-19 (×2): 2 mg via INTRAVENOUS
  Administered 2023-09-19: 4 mg via INTRAVENOUS
  Filled 2023-09-19: qty 2
  Filled 2023-09-19 (×2): qty 1

## 2023-09-19 MED ORDER — FOLIC ACID 1 MG PO TABS
1.0000 mg | ORAL_TABLET | Freq: Every day | ORAL | Status: DC
  Administered 2023-09-19 – 2023-09-21 (×3): 1 mg via ORAL
  Filled 2023-09-19 (×3): qty 1

## 2023-09-19 MED ORDER — ENOXAPARIN SODIUM 40 MG/0.4ML IJ SOSY
40.0000 mg | PREFILLED_SYRINGE | INTRAMUSCULAR | Status: DC
  Administered 2023-09-19 – 2023-09-20 (×2): 40 mg via SUBCUTANEOUS
  Filled 2023-09-19 (×2): qty 0.4

## 2023-09-19 NOTE — ED Notes (Signed)
 Discussed pt status with hospitalist

## 2023-09-19 NOTE — ED Notes (Signed)
 Pt in bed, monitoring cords off

## 2023-09-19 NOTE — ED Provider Notes (Signed)
  Provider Note MRN:  [id number]  Arrival date & time: 09/19/23    ED Course and Medical Decision Making  Assumed care of patient at sign-out or upon transfer.  Suspect alcohol withdrawal seizure with dehydration, plan is to normalize and hopefully discharge.  3 AM update: Further history obtained from patient's mother, patient has been having intermittent seizures for the past few years.  Drinks 18 beers per day, he is drinking is driven by depression.  He found his father dead in their home a few years ago and he was strong for the family for the first year or so but since then he has taken a mental decline and has resorted to heavy drinking.  Patient denies history of withdrawal and family does not think that he has withdrawal seizures and so there is a lingering concern the patient has an underlying seizure disorder and possibly the alcohol it is lowering his seizure threshold.  However currently patient is tremulous and anxious appearing and seems to have an elevated CIWA score, last alcoholic beverage was several hours ago.  CT head obtained and is unremarkable.  I discussed the case with Dr. Derry Lory, patient is appropriate for admission here at [facility] for alcohol withdrawal treatment.  Given the seizure concern can obtain routine EEG here at [facility] in the morning.  Patient is hesitant with concept of admission but eventually is agreeable, he does want to change his life and quit drinking.  .Critical Care  Performed by: Sabas Sous, MD Authorized by: Sabas Sous, MD   Critical care provider statement:    Critical care time (minutes):  35   Critical care was necessary to treat or prevent imminent or life-threatening deterioration of the following conditions: Alcohol withdrawal.   Critical care was time spent personally by me on the following activities:  Development of treatment plan with patient or surrogate, discussions with consultants, evaluation of patient's response to  treatment, examination of patient, ordering and review of laboratory studies, ordering and review of radiographic studies, ordering and performing treatments and interventions, pulse oximetry, re-evaluation of patient's condition and review of old charts   Final Clinical Impressions(s) / ED Diagnoses     ICD-10-CM   1. Dehydration  E86.0     2. Seizure-like activity (HCC)  R56.9     3. Alcohol withdrawal seizure with complication (HCC)  F10.939    R56.9     4. Depression, unspecified depression type  F32.A     5. 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 Pilar Plate, MD [facility] [email or web address]    Sabas Sous, MD 09/19/23 [phone]

## 2023-09-19 NOTE — TOC CM/SW Note (Addendum)
 Transition of Care [facility]) - Inpatient Brief Assessment   Patient Details  Name: [name] MRN: [id number] Date of Birth: 1991-02-26  Transition of Care [facility]) CM/SW Contact:    Beather Arbour Phone Number: 09/19/2023, 9:23 AM   Clinical Narrative:  Transition of Care Department [facility]) has reviewed patient and no TOC needs have been identified at this time. We will continue to monitor patient advancement through interdisciplinary progression rounds. If new patient transition needs arise, please place a TOC consult.  PCP list added to AVS , along with substance abuse education/resources patient was agreeable to.   Transition of Care Asessment: Insurance and Status: Insurance coverage has been reviewed Patient has primary care physician: No (PCP List was added to AVS.) Home environment has been reviewed: Single Family Home with parents Prior level of function:: Independent Prior/Current Home Services: No current home services Social Drivers of Health Review: SDOH reviewed no interventions necessary Readmission risk has been reviewed: Yes Transition of care needs: transition of care needs identified, TOC will continue to follow

## 2023-09-19 NOTE — H&P (Signed)
 History and Physical    Patient: [name] ZOX:[id number] DOB: [birth year] DOA: 09/18/2023 DOS: the patient was seen and examined on 09/19/2023 PCP: No primary care provider on file.  Patient coming from: Home  Chief Complaint: No chief complaint on file.  HPI: [name] is a 33 y.o. male with medical history significant of alcohol abuse, hypothyroidism, Hodgkin's lymphoma (in remission) and depression who presents to the emergency department due to witnessed seizures at home.  Patient drinks about 12 pack of beer daily and has been drinking for 5 days except yesterday when he only drank 2 beers.  Patient's mother activated 911 due to witnessing "seizure-like " activity lasting for about 5 minutes.  On arrival of EMS team, patient was already alert and oriented.  Patient endorsed cough and vomiting. Patient states that his drinking was driven by depression.  He found his father dead in the home a few years ago, he was initially strong for family in the first year, but eventually had a mental decline that resulted in heavy drinking.  ED Course:  In the emergency department, patient was tachycardic, BP was 116/99, Other vital signs are within normal range.  Workup in the ED showed macrocytic anemia, normal BMP except for sodium of 133, chloride 92, blood glucose 151.  Albumin 5.2, AST 182 ALT 120, anion gap 19, alcohol level was undetectable, magnesium 2.3, urinalysis was normal, TSH 22.909, lipase 28.  Influenza A, B, SARS-CoV-2, RSV was negative. CT head without contrast showed no acute intracranial normality He was treated with Ativan. Neurologist at [facility] (Dr. Derry Lory) was consulted regarding the seizures and recommended that patient can stay here at [facility] and that EEG can be done in the morning.  [facility] was asked to admit patient.   Review of Systems: Review of systems as noted in the HPI. All other systems reviewed and are negative.   Past Medical History:  Diagnosis Date    Alcohol abuse    Depression    Lymphoma (HCC)    Thyroid disease    Past Surgical History:  Procedure Laterality Date   APPENDECTOMY      Social History:  reports that he has been smoking. He does not have any smokeless tobacco history on file. He reports current alcohol use. He reports current drug use.   Allergies  Allergen Reactions   Morphine And Codeine Itching    Family history No pertinent family history   Prior to Admission medications   Medication Sig Start Date End Date Taking? Authorizing Provider  hydrOXYzine (ATARAX/VISTARIL) 50 MG tablet Take 1 tablet (50 mg total) by mouth at bedtime. 03/22/15   Adonis Brook, NP    Physical Exam: BP 123/81   Pulse 95   Temp 99.3 F (37.4 C) (Oral)   Resp 16   SpO2 97%   General: 33 y.o. year-old male ill appearing, but in no acute distress.  Alert and oriented x3. HEENT: NCAT, EOMI Neck: Supple, trachea medial, dry mucous membrane Cardiovascular: Regular rate and rhythm with no rubs or gallops.  No thyromegaly or JVD noted.  No lower extremity edema. 2/4 pulses in all 4 extremities. Respiratory: Clear to auscultation with no wheezes or rales. Good inspiratory effort. Abdomen: Soft, nontender nondistended with normal bowel sounds x4 quadrants. Muskuloskeletal: No cyanosis, clubbing or edema noted bilaterally Neuro: Hand tremors on outstretched hands.  CN II-XII intact, strength 5/5 x 4, sensation, reflexes intact Skin: No ulcerative lesions noted or rashes Psychiatry:  Mood is anxious  Labs on Admission:  Basic Metabolic Panel: Recent Labs  Lab 09/18/23 2213  NA 133*  K 3.8  CL 92*  CO2 22  GLUCOSE 151*  BUN 6  CREATININE 0.70  CALCIUM 10.0  MG 2.3   Liver Function Tests: Recent Labs  Lab 09/18/23 2213  AST 182*  ALT 120*  ALKPHOS 116  BILITOT 1.0  PROT 8.1  ALBUMIN 5.2*   Recent Labs  Lab 09/18/23 2213  LIPASE 28   No results for input(s): "AMMONIA" in the last 168  hours. CBC: Recent Labs  Lab 09/18/23 2213  WBC 6.4  NEUTROABS 5.3  HGB 11.8*  HCT 34.3*  MCV 100.9*  PLT 152   Cardiac Enzymes: No results for input(s): "CKTOTAL", "CKMB", "CKMBINDEX", "TROPONINI" in the last 168 hours.  BNP (last 3 results) No results for input(s): "BNP" in the last 8760 hours.  ProBNP (last 3 results) No results for input(s): "PROBNP" in the last 8760 hours.  CBG: No results for input(s): "GLUCAP" in the last 168 hours.  Radiological Exams on Admission: CT HEAD WO CONTRAST ( ) Result Date: 09/19/2023 CLINICAL DATA:  Initial evaluation for acute head trauma. EXAM: CT HEAD WITHOUT CONTRAST TECHNIQUE: Contiguous axial images were obtained from the base of the skull through the vertex without intravenous contrast. RADIATION DOSE REDUCTION: This exam was performed according to the departmental dose-optimization program which includes automated exposure control, adjustment of the mA and/or kV according to patient size and/or use of iterative reconstruction technique. COMPARISON:  CT from 04/03/2012. FINDINGS: Brain: Cerebral volume within normal limits for age no acute intracranial hemorrhage. No acute large vessel territory infarct. No mass lesion or midline shift. No hydrocephalus or extra-axial fluid collection. Vascular: No abnormal hyperdense vessel. Skull: Scalp soft tissues demonstrate no acute finding. Calvarium intact. Sinuses/Orbits: Globes orbital soft tissues within normal limits. Visualized paranasal sinuses are clear. No mastoid effusion. Other: None. IMPRESSION: Normal head CT. No acute intracranial abnormality. Electronically Signed   By: Rise Mu M.D.   On: 09/19/2023 03:25    EKG: I independently viewed the EKG done and my findings are as followed: Sinus tachycardia at a rate of 108 bpm  Assessment/Plan Present on Admission: **None**  Principal Problem:   Alcohol withdrawal seizure (HCC) Active Problems:   Dehydration    Transaminitis   Elevated TSH   Macrocytic anemia   Hyperglycemia  Alcohol withdrawal seizure Alcohol level was < 10 Continue CIWA protocol, thiamine, folic acid and multivitamin Continue fall precaution and neuro checks Patient will be counseled on alcohol withdrawal cessation when more stable EEG will be done in the morning to rule out seizures Neurology consulted and we shall await further recommendations  Dehydration Continue IV Hydration  Transaminitis possibly due to alcohol abuse AST 182, ALT 120 Continue to monitor liver enzymes  Elevated TSH TSH 22.909, free T4 will be checked  Macrocytic anemia MCV 100.9, folic acid and vitamin B12 levels will be checked  Hyperglycemia possibly reactive CBG 151, continue to monitor glucose levels   DVT prophylaxis: Lovenox  Code Status: Full code  Family Communication: None at bedside  Consults: None  Severity of Illness: The appropriate patient status for this patient is INPATIENT. Inpatient status is judged to be reasonable and necessary in order to provide the required intensity of service to ensure the patient's safety. The patient's presenting symptoms, physical exam findings, and initial radiographic and laboratory data in the context of their chronic comorbidities is felt to place them at high risk for further  clinical deterioration. Furthermore, it is not anticipated that the patient will be medically stable for discharge from the hospital within 2 midnights of admission.   * I certify that at the point of admission it is my clinical judgment that the patient will require inpatient hospital care spanning beyond 2 midnights from the point of admission due to high intensity of service, high risk for further deterioration and high frequency of surveillance required.*  Author: Frankey Shown, DO 09/19/2023 5:17 AM  For on call review [email or web address].

## 2023-09-19 NOTE — ED Notes (Signed)
 Lab at bedside

## 2023-09-19 NOTE — ED Notes (Signed)
 Pt out of bed, trailing blood in floor from IV being pulled out, pt very unsteady attempting to go into hall bathroom stating "get him out of there" gesturing toward his room, this RN and Psychologist, forensic advised there is no one in his room, pt states there is "a big black man in there", IV removed, bleeding controlled, escorted pt back to bed and room, bed linens changed, pt hard to redirect

## 2023-09-19 NOTE — Progress Notes (Signed)
 EEG complete - results pending

## 2023-09-19 NOTE — Progress Notes (Signed)
 Subjective: Patient admitted this morning, see detailed H&P by Dr Thomes Dinning 33 y.o. male with medical history significant of alcohol abuse, hypothyroidism, Hodgkin's lymphoma (in remission) and depression who presents to the emergency department due to witnessed seizures at home.  Patient drinks about 12 pack of beer daily and has been drinking for 5 days except yesterday when he only drank 2 beers.  Patient's mother activated 911 due to witnessing "seizure-like " activity lasting for about 5 minutes  Workup in the ED showed macrocytic anemia, normal BMP except for sodium of 133, chloride 92, blood glucose 151.  Albumin 5.2, AST 182 ALT 120, anion gap 19, alcohol level was undetectable, magnesium 2.3, urinalysis was normal, TSH 22.909, lipase 28.  Influenza A, B, SARS-CoV-2, RSV was negative. CT head without contrast showed no acute intracranial normality He was treated with Ativan. Neurologist at [facility] (Dr. Derry Lory) was consulted regarding the seizures and recommended that patient can stay here at [facility] and that EEG can be done in the morning. Vitals:   09/19/23 0700 09/19/23 0743  BP: 110/79   Pulse: 84   Resp:    Temp:  98 F (36.7 C)  SpO2: 97%       A/P Alcohol withdrawal seizure Alcohol level was < 10 Continue CIWA protocol, thiamine, folic acid and multivitamin Continue fall precaution and neuro checks Patient will be counseled on alcohol withdrawal cessation when more stable EEG will be done in the morning to rule out seizures Neurology consulted and we shall await further recommendations   Dehydration Continue IV Hydration   Transaminitis possibly due to alcohol abuse AST 182, ALT 120 Continue to monitor liver enzymes   Elevated TSH TSH 22.909, free T4 will be checked   Macrocytic anemia MCV 100.9, folic acid and vitamin B12 levels will be checked   Hyperglycemia possibly reactive CBG 151, continue to monitor glucose levels    Meredeth Ide [facility]

## 2023-09-19 NOTE — ED Notes (Signed)
 ED TO INPATIENT HANDOFF REPORT  ED Nurse Name and Phone #: Haig Prophet RN [phone]  S Name/Age/Gender [name] 33 y.o. male Room/Bed: APA16A/APA16A  Code Status   Code Status: Full Code  Home/SNF/Other Home Patient oriented to: self and place Is this baseline? Yes   Triage Complete: Triage complete  Chief Complaint Alcohol withdrawal seizure (HCC) [F10.939, R56.9]  Triage Note Pt bib [facility]. Per EMS pt has been drinking x5 days, pts mother called 911 stating pt was having "seizure like" activity lasting for aprrox. 5 mins. EMS reports upon their arrival pt was alert and oriented.     Pt states he has had 2 alcoholic beverage today, just prior to EMS arrival but that is significantly less than he normally has.    Allergies Allergies  Allergen Reactions   Morphine And Codeine Itching    Level of Care/Admitting Diagnosis ED Disposition     ED Disposition  Admit   Condition  --   Comment  Hospital Area: [facility] [[id number]]  Level of Care: Stepdown [14]  Covid Evaluation: Asymptomatic - no recent exposure (last 10 days) testing not required  Diagnosis: Alcohol withdrawal seizure [facility]) [[id number]]  Admitting Physician: Frankey Shown [[id number]]  Attending Physician: Frankey Shown [[id number]]  Certification:: I certify this patient will need inpatient services for at least 2 midnights  Expected Medical Readiness: 09/22/2023          B Medical/Surgery History Past Medical History:  Diagnosis Date   Alcohol abuse    Depression    Lymphoma (HCC)    Thyroid disease    Past Surgical History:  Procedure Laterality Date   APPENDECTOMY       A IV Location/Drains/Wounds Patient Lines/Drains/Airways Status     Active Line/Drains/Airways     Name Placement date Placement time Site Days   Peripheral IV 09/19/23 22 G 1" Left;Posterior Forearm 09/19/23  0430  Forearm  less than 1            Intake/Output Last 24 hours No intake or  output data in the 24 hours ending 09/19/23 5784  Labs/Imaging Results for orders placed or performed during the hospital encounter of 09/18/23 (from the past 48 hours)  CBC with Differential     Status: Abnormal   Collection Time: 09/18/23 10:13 PM  Result Value Ref Range   WBC 6.4 4.0 - 10.5 K/uL   RBC 3.40 (L) 4.22 - 5.81 MIL/uL   Hemoglobin 11.8 (L) 13.0 - 17.0 g/dL   HCT 69.6 (L) 29.5 - 28.4 %   MCV 100.9 (H) 80.0 - 100.0 fL   MCH 34.7 (H) 26.0 - 34.0 pg   MCHC 34.4 30.0 - 36.0 g/dL   RDW 13.2 44.0 - 10.2 %   Platelets 152 150 - 400 K/uL   nRBC 0.0 0.0 - 0.2 %   Neutrophils Relative % 82 %   Neutro Abs 5.3 1.7 - 7.7 K/uL   Lymphocytes Relative 7 %   Lymphs Abs 0.4 (L) 0.7 - 4.0 K/uL   Monocytes Relative 9 %   Monocytes Absolute 0.6 0.1 - 1.0 K/uL   Eosinophils Relative 0 %   Eosinophils Absolute 0.0 0.0 - 0.5 K/uL   Basophils Relative 1 %   Basophils Absolute 0.1 0.0 - 0.1 K/uL   Immature Granulocytes 1 %   Abs Immature Granulocytes 0.08 (H) 0.00 - 0.07 K/uL    Comment: Performed at [facility], [address] [place], [place] [address]  Comprehensive metabolic panel  Status: Abnormal   Collection Time: 09/18/23 10:13 PM  Result Value Ref Range   Sodium 133 (L) 135 - 145 mmol/L   Potassium 3.8 3.5 - 5.1 mmol/L   Chloride 92 (L) 98 - 111 mmol/L   CO2 22 22 - 32 mmol/L   Glucose, Bld 151 (H) 70 - 99 mg/dL    Comment: Glucose reference range applies only to samples taken after fasting for at least 8 hours.   BUN 6 6 - 20 mg/dL   Creatinine, Ser 2.84 0.61 - 1.24 mg/dL   Calcium 13.2 8.9 - 44.0 mg/dL   Total Protein 8.1 6.5 - 8.1 g/dL   Albumin 5.2 (H) 3.5 - 5.0 g/dL   AST 102 (H) 15 - 41 U/L   ALT 120 (H) 0 - 44 U/L   Alkaline Phosphatase 116 38 - 126 U/L   Total Bilirubin 1.0 0.0 - 1.2 mg/dL   GFR, Estimated >72 >53 mL/min    Comment: (NOTE) Calculated using the CKD-EPI Creatinine Equation (2021)    Anion gap 19 (H) 5 - 15    Comment: Performed at [facility], [address] [place], [place] [address]  Lipase, blood     Status: None   Collection Time: 09/18/23 10:13 PM  Result Value Ref Range   Lipase 28 11 - 51 U/L    Comment: Performed at [facility], [address] [place], [place] [address]  Ethanol     Status: None   Collection Time: 09/18/23 10:13 PM  Result Value Ref Range   Alcohol, Ethyl (B) <10 <10 mg/dL    Comment: (NOTE) Lowest detectable limit for serum alcohol is 10 mg/dL.  For medical purposes only. Performed at [facility], [address], [place], [place] [address]   TSH     Status: Abnormal   Collection Time: 09/18/23 10:13 PM  Result Value Ref Range   TSH 22.909 (H) 0.350 - 4.500 uIU/mL    Comment: Performed by a 3rd Generation assay with a functional sensitivity of <=0.01 uIU/mL. Performed at [facility], [address] [place], [place] [address]   Magnesium     Status: None   Collection Time: 09/18/23 10:13 PM  Result Value Ref Range   Magnesium 2.3 1.7 - 2.4 mg/dL    Comment: Performed at [facility], [address], [place], [place] [address]  Folate     Status: None   Collection Time: 09/18/23 10:13 PM  Result Value Ref Range   Folate 8.1 >5.9 ng/mL    Comment: Performed at [facility], [address], [place], [place] [address]  Vitamin B12     Status: None   Collection Time: 09/18/23 10:13 PM  Result Value Ref Range   Vitamin B-12 330 180 - 914 pg/mL    Comment: (NOTE) This assay is not validated for testing neonatal or myeloproliferative syndrome specimens for Vitamin B12 levels. Performed at [facility], [address], [place], [place] [address]   Resp panel by RT-PCR (RSV, Flu A&B, Covid) Anterior Nasal Swab     Status: None   Collection Time: 09/18/23 10:37 PM   Specimen: Anterior Nasal Swab  Result Value Ref Range   SARS Coronavirus 2 by RT PCR NEGATIVE NEGATIVE    Comment: (NOTE) SARS-CoV-2 target nucleic acids are NOT DETECTED.  The SARS-CoV-2 RNA is generally detectable in upper  respiratory specimens during the acute phase of infection. The lowest concentration of SARS-CoV-2 viral copies this assay can detect is 138 copies/mL. A  negative result does not preclude SARS-Cov-2 infection and should not be used as the sole basis for treatment or other patient management decisions. A negative result may occur with  improper specimen collection/handling, submission of specimen other than nasopharyngeal swab, presence of viral mutation(s) within the areas targeted by this assay, and inadequate number of viral copies(<138 copies/mL). A negative result must be combined with clinical observations, patient history, and epidemiological information. The expected result is Negative.  Fact Sheet for Patients:  BloggerCourse.com  Fact Sheet for Healthcare Providers:  SeriousBroker.it  This test is no t yet approved or cleared by the [place] FDA and  has been authorized for detection and/or diagnosis of SARS-CoV-2 by FDA under an Emergency Use Authorization (EUA). This EUA will remain  in effect (meaning this test can be used) for the duration of the COVID-19 declaration under Section 564(b)(1) of the Act, 21 U.S.C.section 360bbb-3(b)(1), unless the authorization is terminated  or revoked sooner.       Influenza A by PCR NEGATIVE NEGATIVE   Influenza B by PCR NEGATIVE NEGATIVE    Comment: (NOTE) The Xpert Xpress SARS-CoV-2/FLU/RSV plus assay is intended as an aid in the diagnosis of influenza from Nasopharyngeal swab specimens and should not be used as a sole basis for treatment. Nasal washings and aspirates are unacceptable for Xpert Xpress SARS-CoV-2/FLU/RSV testing.  Fact Sheet for Patients: BloggerCourse.com  Fact Sheet for Healthcare Providers: SeriousBroker.it  This test is not yet approved or cleared by the [place] FDA and has been authorized for  detection and/or diagnosis of SARS-CoV-2 by FDA under an Emergency Use Authorization (EUA). This EUA will remain in effect (meaning this test can be used) for the duration of the COVID-19 declaration under Section 564(b)(1) of the Act, 21 U.S.C. section 360bbb-3(b)(1), unless the authorization is terminated or revoked.     Resp Syncytial Virus by PCR NEGATIVE NEGATIVE    Comment: (NOTE) Fact Sheet for Patients: BloggerCourse.com  Fact Sheet for Healthcare Providers: SeriousBroker.it  This test is not yet approved or cleared by the [place] FDA and has been authorized for detection and/or diagnosis of SARS-CoV-2 by FDA under an Emergency Use Authorization (EUA). This EUA will remain in effect (meaning this test can be used) for the duration of the COVID-19 declaration under Section 564(b)(1) of the Act, 21 U.S.C. section 360bbb-3(b)(1), unless the authorization is terminated or revoked.  Performed at [facility], [address], [place], [place] [address]   Urinalysis, Routine w reflex microscopic -Urine, Clean Catch     Status: Abnormal   Collection Time: 09/19/23 12:32 AM  Result Value Ref Range   Color, Urine YELLOW YELLOW   APPearance HAZY (A) CLEAR   Specific Gravity, Urine 1.011 1.005 - 1.030   pH 5.0 5.0 - 8.0   Glucose, UA NEGATIVE NEGATIVE mg/dL   Hgb urine dipstick NEGATIVE NEGATIVE   Bilirubin Urine NEGATIVE NEGATIVE   Ketones, ur 20 (A) NEGATIVE mg/dL   Protein, ur 962 (A) NEGATIVE mg/dL   Nitrite NEGATIVE NEGATIVE   Leukocytes,Ua NEGATIVE NEGATIVE   RBC / HPF 0-5 0 - 5 RBC/hpf   WBC, UA 11-20 0 - 5 WBC/hpf   Bacteria, UA RARE (A) NONE SEEN   Squamous Epithelial / HPF 0-5 0 - 5 /HPF   Mucus PRESENT    Hyaline Casts, UA PRESENT     Comment: Performed at [facility], [address] [place], [place] [address]  Rapid urine drug screen (hospital performed)     Status:  Abnormal   Collection Time: 09/19/23  12:32 AM  Result Value Ref Range   Opiates NONE DETECTED NONE DETECTED   Cocaine NONE DETECTED NONE DETECTED   Benzodiazepines POSITIVE (A) NONE DETECTED   Amphetamines NONE DETECTED NONE DETECTED   Tetrahydrocannabinol NONE DETECTED NONE DETECTED   Barbiturates NONE DETECTED NONE DETECTED    Comment: (NOTE) DRUG SCREEN FOR MEDICAL PURPOSES ONLY.  IF CONFIRMATION IS NEEDED FOR ANY PURPOSE, NOTIFY LAB WITHIN 5 DAYS.  LOWEST DETECTABLE LIMITS FOR URINE DRUG SCREEN Drug Class                     Cutoff (ng/mL) Amphetamine and metabolites    1000 Barbiturate and metabolites    200 Benzodiazepine                 200 Opiates and metabolites        300 Cocaine and metabolites        300 THC                            50 Performed at [facility], [address], [place], [place] [address]    CT HEAD WO CONTRAST ( ) Result Date: 09/19/2023 CLINICAL DATA:  Initial evaluation for acute head trauma. EXAM: CT HEAD WITHOUT CONTRAST TECHNIQUE: Contiguous axial images were obtained from the base of the skull through the vertex without intravenous contrast. RADIATION DOSE REDUCTION: This exam was performed according to the departmental dose-optimization program which includes automated exposure control, adjustment of the mA and/or kV according to patient size and/or use of iterative reconstruction technique. COMPARISON:  CT from 04/03/2012. FINDINGS: Brain: Cerebral volume within normal limits for age no acute intracranial hemorrhage. No acute large vessel territory infarct. No mass lesion or midline shift. No hydrocephalus or extra-axial fluid collection. Vascular: No abnormal hyperdense vessel. Skull: Scalp soft tissues demonstrate no acute finding. Calvarium intact. Sinuses/Orbits: Globes orbital soft tissues within normal limits. Visualized paranasal sinuses are clear. No mastoid effusion. Other: None. IMPRESSION: Normal head CT. No acute intracranial abnormality. Electronically Signed   By: Rise Mu M.D.   On: 09/19/2023 03:25    Pending Labs Unresulted Labs (From admission, onward)     Start     Ordered   09/26/23 0500  Creatinine, serum  (enoxaparin (LOVENOX)    CrCl >/= 30 ml/min)  Weekly,   R     Comments: while on enoxaparin therapy    09/19/23 0509   09/19/23 0630  CBC  Once,   R        09/19/23 0630   09/19/23 0630  Comprehensive metabolic panel with GFR  Once,   R        09/19/23 0630   09/19/23 0630  HIV Antibody (routine testing w rflx)  Once,   R        09/19/23 0630   09/19/23 0600  Magnesium  Once,   R        09/19/23 0600   09/19/23 0600  Phosphorus  Once,   R        09/19/23 0600   09/19/23 0501  T4, free  Tomorrow morning,   R        09/19/23 0501            Vitals/Pain Today's Vitals   09/19/23 0429 09/19/23 0441 09/19/23 0610 09/19/23 0632  BP: 123/81 123/81 96/71   Pulse: 95  79   Resp:  Temp:      TempSrc:      SpO2:   96%   Weight:    74.8 kg  Height:    5\' 6"  (1.676 m)  PainSc:        Isolation Precautions No active isolations  Medications Medications  LORazepam (ATIVAN) tablet 1-4 mg ( Oral See Alternative 09/19/23 0443)    Or  LORazepam (ATIVAN) injection 1-4 mg (4 mg Intravenous Given 09/19/23 0443)  thiamine (VITAMIN B1) tablet 100 mg (has no administration in time range)    Or  thiamine (VITAMIN B1) injection 100 mg (has no administration in time range)  folic acid (FOLVITE) tablet 1 mg (has no administration in time range)  multivitamin with minerals tablet 1 tablet (has no administration in time range)  chlordiazePOXIDE (LIBRIUM) capsule 5 mg (has no administration in time range)  lactated ringers infusion ( Intravenous New Bag/Given 09/19/23 0611)  enoxaparin (LOVENOX) injection 40 mg (has no administration in time range)  sodium chloride 0.9 % bolus 1,000 mL (0 mLs Intravenous Stopped 09/18/23 2319)  ondansetron (ZOFRAN) injection 4 mg (4 mg Intravenous Given 09/18/23 2210)  LORazepam (ATIVAN) injection 1 mg (1  mg Intravenous Given 09/18/23 2210)  sodium chloride 0.9 % bolus 1,000 mL (0 mLs Intravenous Stopped 09/19/23 0129)  LORazepam (ATIVAN) injection 1 mg (1 mg Intravenous Given 09/19/23 0042)    Mobility walks     Focused Assessments Cardiac Assessment Handoff:  Cardiac Rhythm: Sinus tachycardia No results found for: "CKTOTAL", "CKMB", "CKMBINDEX", "TROPONINI" No results found for: "DDIMER" Does the Patient currently have chest pain? No    R Recommendations: See Admitting Provider Note  Report given to:   Additional Notes:

## 2023-09-19 NOTE — Discharge Instructions (Signed)

## 2023-09-19 NOTE — Plan of Care (Signed)
 Discussed with patient plan of care for the evening, pain management and withdrawal signs and symptoms with some teach back displayed.    Problem: Education: Goal: Knowledge of General Education information will improve Description: Including pain rating scale, medication(s)/side effects and non-pharmacologic comfort measures Outcome: Progressing   Problem: Health Behavior/Discharge Planning: Goal: Ability to manage health-related needs will improve Outcome: Not Progressing

## 2023-09-19 NOTE — ED Notes (Addendum)
 Pt in bed, monitoring cords removed again; discussed importance of leaving monitors on, assisted pt to stand at bedside to obtain urine sample, very unsteady on feet, pt assisted back in bed, monitors reapplied, pt has pulled IV out, bleeding controlled, dressing applied

## 2023-09-19 NOTE — ED Notes (Signed)
 Pt refuses to leave monitor or cords on for v/s, pt having auditory hallucinations

## 2023-09-20 DIAGNOSIS — F10930 Alcohol use, unspecified with withdrawal, uncomplicated: Secondary | ICD-10-CM | POA: Diagnosis not present

## 2023-09-20 DIAGNOSIS — R7989 Other specified abnormal findings of blood chemistry: Secondary | ICD-10-CM

## 2023-09-20 DIAGNOSIS — R569 Unspecified convulsions: Secondary | ICD-10-CM | POA: Diagnosis not present

## 2023-09-20 DIAGNOSIS — E890 Postprocedural hypothyroidism: Secondary | ICD-10-CM | POA: Diagnosis not present

## 2023-09-20 DIAGNOSIS — F10939 Alcohol use, unspecified with withdrawal, unspecified: Secondary | ICD-10-CM | POA: Diagnosis not present

## 2023-09-20 LAB — COMPREHENSIVE METABOLIC PANEL WITH GFR
ALT: 76 U/L — ABNORMAL HIGH (ref 0–44)
AST: 102 U/L — ABNORMAL HIGH (ref 15–41)
Albumin: 4.2 g/dL (ref 3.5–5.0)
Alkaline Phosphatase: 99 U/L (ref 38–126)
Anion gap: 13 (ref 5–15)
BUN: 5 mg/dL — ABNORMAL LOW (ref 6–20)
CO2: 23 mmol/L (ref 22–32)
Calcium: 9.1 mg/dL (ref 8.9–10.3)
Chloride: 95 mmol/L — ABNORMAL LOW (ref 98–111)
Creatinine, Ser: 0.58 mg/dL — ABNORMAL LOW (ref 0.61–1.24)
GFR, Estimated: >60 mL/min (ref >60)
Glucose, Bld: 89 mg/dL (ref 70–99)
Potassium: 3.5 mmol/L (ref 3.5–5.1)
Sodium: 131 mmol/L — ABNORMAL LOW (ref 135–145)
Total Bilirubin: 1 mg/dL (ref 0.0–1.2)
Total Protein: 7.1 g/dL (ref 6.5–8.1)

## 2023-09-20 LAB — CBC
HCT: 36.1 % — ABNORMAL LOW (ref 39.0–52.0)
Hemoglobin: 11.9 g/dL — ABNORMAL LOW (ref 13.0–17.0)
MCH: 34.3 pg — ABNORMAL HIGH (ref 26.0–34.0)
MCHC: 33 g/dL (ref 30.0–36.0)
MCV: 104 fL — ABNORMAL HIGH (ref 80.0–100.0)
Platelets: 100 10*3/uL — ABNORMAL LOW (ref 150–400)
RBC: 3.47 MIL/uL — ABNORMAL LOW (ref 4.22–5.81)
RDW: 12.7 % (ref 11.5–15.5)
WBC: 3.9 10*3/uL — ABNORMAL LOW (ref 4.0–10.5)
nRBC: 0 % (ref 0.0–0.2)

## 2023-09-20 LAB — GLUCOSE, CAPILLARY
Glucose-Capillary: 95 mg/dL (ref 70–99)
Glucose-Capillary: 84 mg/dL (ref 70–99)
Glucose-Capillary: 103 mg/dL — ABNORMAL HIGH (ref 70–99)
Glucose-Capillary: 143 mg/dL — ABNORMAL HIGH (ref 70–99)

## 2023-09-20 MED ORDER — CHLORHEXIDINE GLUCONATE CLOTH 2 % EX PADS
6.0000 | MEDICATED_PAD | Freq: Every day | CUTANEOUS | Status: DC
  Administered 2023-09-20 – 2023-09-21 (×2): 6 via TOPICAL

## 2023-09-20 MED ORDER — LEVOTHYROXINE SODIUM 25 MCG PO TABS
25.0000 ug | ORAL_TABLET | Freq: Every day | ORAL | Status: DC
  Administered 2023-09-20 – 2023-09-21 (×2): 25 ug via ORAL
  Filled 2023-09-20 (×2): qty 1

## 2023-09-20 NOTE — Procedures (Signed)
 Patient Name: [name]  MRN: [id number]  Epilepsy Attending: Charlsie Quest  Referring Physician/Provider: Sabas Sous, MD  Date: 09/19/2023 Duration: 22.25 mins  Patient history: 33yo M with alcohol withdrawal seizure. EEG to evaluate for seizure  Level of alertness: Awake  AEDs during EEG study: Ativan  Technical aspects: This EEG study was done with scalp electrodes positioned according to the 10-20 International system of electrode placement. Electrical activity was reviewed with band pass filter of 1-70Hz , sensitivity of 7 uV/mm, display speed of 4mm/sec with a 60Hz  notched filter applied as appropriate. EEG data were recorded continuously and digitally stored.  Video monitoring was available and reviewed as appropriate.  Description: The posterior dominant rhythm consists of 8 Hz activity of moderate voltage (25-35 uV) seen predominantly in posterior head regions, symmetric and reactive to eye opening and eye closing. Physiologic photic driving was seen during photic stimulation.  Hyperventilation was not performed.     EEG was technically difficult due to significant electrode artifact.  IMPRESSION: This technically difficult study is within normal limits. No seizures or epileptiform discharges were seen throughout the recording.  A normal interictal EEG does not exclude the diagnosis of epilepsy.  Rockie Vawter Annabelle Harman

## 2023-09-20 NOTE — Progress Notes (Signed)
 [facility]  PROGRESS NOTE  [name] UJW:[id number] DOB: 01/05/1991 DOA: 09/18/2023 PCP: No primary care provider on file.   Brief HPI:   33 y.o. male with medical history significant of alcohol abuse, hypothyroidism, Hodgkin's lymphoma (in remission) and depression who presents to the emergency department due to witnessed seizures at home.  Patient drinks about 12 pack of beer daily and has been drinking for 5 days except yesterday when he only drank 2 beers.  Patient's mother activated 911 due to witnessing "seizure-like " activity lasting for about 5 minutes  Workup in the ED showed macrocytic anemia, normal BMP except for sodium of 133, chloride 92, blood glucose 151.  Albumin 5.2, AST 182 ALT 120, anion gap 19, alcohol level was undetectable, magnesium 2.3, urinalysis was normal, TSH 22.909, lipase 28.  Influenza A, B, SARS-CoV-2, RSV was negative. CT head without contrast showed no acute intracranial normality He was treated with Ativan. Neurologist at [facility] (Dr. Derry Lory) was consulted regarding the seizures and recommended that patient can stay here at [facility] and that EEG can be done in the morning.    Assessment/Plan:   Alcohol withdrawal seizure Alcohol level was < 10 Continue CIWA protocol, thiamine, folic acid and multivitamin Continue fall precaution and neuro checks EEG was done which was negative for epileptiform discharges Discussed with neurology, no AED indicated. -No driving and seizure precautions till cleared by neurology, follow-up with neurology in 1 month   Dehydration Continue IV Hydration   Transaminitis possibly due to alcohol abuse AST 182, ALT 120 Continue to monitor liver enzymes   Elevated TSH TSH 22.909, free T4 was found to be low at 0.49 -Patient said that he was taking Synthroid in the past but stopped taking as he was not feeling good on that -Will start Synthroid 25 mcg daily   Macrocytic anemia MCV 100.9, folic acid 8.1 and vitamin  B12 levels 330        Medications     Chlorhexidine Gluconate Cloth  6 each Topical Q0600   enoxaparin (LOVENOX) injection  40 mg Subcutaneous Q24H   folic acid  1 mg Oral Daily   multivitamin with minerals  1 tablet Oral Daily   mupirocin ointment   Nasal BID   nicotine  21 mg Transdermal Daily   thiamine  100 mg Oral Daily   Or   thiamine  100 mg Intravenous Daily     Data Reviewed:   CBG:  Recent Labs  Lab 09/19/23 1619 09/19/23 2114 09/19/23 2347 09/20/23 0405 09/20/23 0752  GLUCAP 76 123* 101* 95 84    SpO2: 98 %    Vitals:   09/20/23 0500 09/20/23 0600 09/20/23 0700 09/20/23 0814  BP: (!) 133/90 112/85 122/87   Pulse: 100 93 91   Resp: 17 17    Temp:    98.3 F (36.8 C)  TempSrc:    Oral  SpO2: 96% 98% 98%   Weight:      Height:          Data Reviewed:  Basic Metabolic Panel: Recent Labs  Lab 09/18/23 2213 09/19/23 0633 09/20/23 0443  NA 133* 132* 131*  K 3.8 4.1 3.5  CL 92* 96* 95*  CO2 22 23 23   GLUCOSE 151* 80 89  BUN 6 6 5*  CREATININE 0.70 0.65 0.58*  CALCIUM 10.0 9.0 9.1  MG 2.3 2.2  --   PHOS  --  3.3  --     CBC: Recent Labs  Lab 09/18/23  2213 09/19/23 0633 09/20/23 0443  WBC 6.4 4.2 3.9*  NEUTROABS 5.3  --   --   HGB 11.8* 10.5* 11.9*  HCT 34.3* 31.0* 36.1*  MCV 100.9* 103.0* 104.0*  PLT 152 117* 100*    LFT Recent Labs  Lab 09/18/23 2213 09/19/23 0633 09/20/23 0443  AST 182* 123* 102*  ALT 120* 90* 76*  ALKPHOS 116 91 99  BILITOT 1.0 1.4* 1.0  PROT 8.1 6.9 7.1  ALBUMIN 5.2* 4.2 4.2     Antibiotics: Anti-infectives (From admission, onward)    None        DVT prophylaxis: Lovenox  Code Status: Full code  Family Communication: No family at bedside   CONSULTS    Subjective   Feels better today.  EEG negative for seizure   Objective    Physical Examination:   General-appears in no acute distress Heart-S1-S2, regular, no murmur auscultated Lungs-clear to auscultation  bilaterally, no wheezing or crackles auscultated Abdomen-soft, nontender, no organomegaly Extremities-no edema in the lower extremities Neuro-alert, oriented x3, no focal deficit noted  Status is: Inpatient:             Meredeth Ide   [facility] If 7PM-7AM, please contact night-coverage at [email or web address], Office  [phone]   09/20/2023, 8:24 AM  LOS: 1 day

## 2023-09-20 NOTE — Progress Notes (Signed)
 Per patient's mother, patient has not had a neurologist. And does not have anyone checking his thyroid levels. Patient has not had a check-up with a doctor since 2016 which was when patient had his lymphoma dx and treatments.

## 2023-09-21 DIAGNOSIS — F10930 Alcohol use, unspecified with withdrawal, uncomplicated: Secondary | ICD-10-CM | POA: Diagnosis not present

## 2023-09-21 DIAGNOSIS — E860 Dehydration: Secondary | ICD-10-CM | POA: Diagnosis not present

## 2023-09-21 DIAGNOSIS — R739 Hyperglycemia, unspecified: Secondary | ICD-10-CM | POA: Diagnosis not present

## 2023-09-21 DIAGNOSIS — E039 Hypothyroidism, unspecified: Secondary | ICD-10-CM | POA: Diagnosis not present

## 2023-09-21 LAB — GLUCOSE, CAPILLARY: Glucose-Capillary: 120 mg/dL — ABNORMAL HIGH (ref 70–99)

## 2023-09-21 MED ORDER — LEVOTHYROXINE SODIUM 25 MCG PO TABS: 25.0000 ug | ORAL_TABLET | Freq: Every day | ORAL | 0 refills | Status: DC

## 2023-09-21 MED ORDER — CHLORDIAZEPOXIDE HCL 5 MG PO CAPS: 5.0000 mg | ORAL_CAPSULE | Freq: Three times a day (TID) | ORAL | 0 refills | Status: DC | PRN

## 2023-09-21 MED ORDER — PANTOPRAZOLE SODIUM 40 MG PO TBEC: 40.0000 mg | DELAYED_RELEASE_TABLET | Freq: Every day | ORAL | 0 refills | Status: AC

## 2023-09-21 MED ORDER — CHLORDIAZEPOXIDE HCL 5 MG PO CAPS: 5.0000 mg | ORAL_CAPSULE | Freq: Three times a day (TID) | ORAL | 0 refills | Status: AC | PRN

## 2023-09-21 MED ORDER — PANTOPRAZOLE SODIUM 40 MG PO TBEC: 40.0000 mg | DELAYED_RELEASE_TABLET | Freq: Every day | ORAL | Status: DC

## 2023-09-21 NOTE — Plan of Care (Signed)

## 2023-09-21 NOTE — Discharge Summary (Addendum)
 Physician Discharge Summary   Patient: [name] MRN: [id number] DOB: 08/16/1990  Admit date:     09/18/2023  Discharge date: 09/21/23  Discharge Physician: Curlee Doss Coley Littles   PCP: No primary care provider on file.   Recommendations at discharge:    Patient will continue taking benzodiazepines, chlordiazepoxide 5 mg as needed every 8 hrs for anxiety or withdrawal symptoms.  He has been advised not to combine alcohol with prescribed medications. Seizure precautions, including not driving until follow up with neurology  Resumed on Levothyroxine Added pantoprazole for GI prophylaxis  Plan to follow up with primary care in 7 to 10 days.  Plan to follow up with Neurology as outpatient in 4 weeks.   I spoke with patient's mother, over the phone we talked in detail about patient's condition, plan of care and prognosis and all questions were addressed.   Discharge Diagnoses: Principal Problem:   Alcohol withdrawal seizure (HCC) Active Problems:   Dehydration   Hyperglycemia   Macrocytic anemia   Hypothyroidism  Resolved Problems:   * No resolved hospital problems. [facility] Hospital Course: [name] was admitted to the hospital with the working diagnosis of alcohol withdrawal syndrome.   33 yo male with the past medical history of alcohol abuse, hypothyroidism, depression and Hodgkin's lymphoma on remission who presented with seizure. Patient has been drinking about 12 pack beer daily for 5 consecutive days. He was seen by his mother to have a seizure and EMS was called. By time time EMS got to his home he had recovered his consciousness.  At the time of his initial physical examination his blood pressure was 123/81, HR 94, RR 16 and 02 saturation 97%.  Awake and alert, lungs with no wheezing or rales, heart with S1 and S2 present and regular, abdomen with no distention and no lower extremity edema.   Na 133, K 3,8 Cl 92 bicarbonate 22, glucose 151, bun 6 cr 0,70  AST 182,  ALT 120 T bilirubin 1,0  Wbc 6.4 hgb 11,8 plt 152  TSH 22,9  Free T 4 0,49  Sars covid 19 negative Influenza negative   Urine analysis SG 1,011, protein 100, negative hgb and negative leukocytes.  Toxicology screen positive for benzodiazepines.  Alcohol level < 10   Head CT with no acute changes.  EKG 108 bpm, normal axis, normal intervals, qtc 470, sinus rhythm with no significant ST segment or  T wave changes.   Patient was placed on alcohol withdrawal protocol.   04/11 EEG with no active seizures.  04/12 clinically improved, symptoms controlled with po lorazepam.  Plan to follow up as outpatient.    Assessment and Plan: * Alcohol withdrawal seizure (HCC) Patient was admitted to the step down unit and placed on alcohol withdrawal protocol, CIWA with as needed benzodiazepines.  Neuro checks and supportive medical therapy.  EEG had no active seizures and neurology recommended no anti epileptic therapy.  Recommendations not to drive until follow up with neurology as outpatient.  Avoid alcohol.   Over the last 24 hrs he has used 3 mg of po lorazepam, and 5 mg bid chlordiazepoxide,  to control his symptoms.  This morning he is feeling well with no tremors or anxiety.  Plan to continue as needed lorazepam at home and close follow up as outpatient.   Elevated liver enzymes due to alcohol abuse, at the time of his discharge AST and ALT trending down with no hyperbilirubinemia. (AST 102, ALT 76, T Bilirubins 1,0)  Will prescribe pantoprazole for the next 14 days for GI prophylaxis.   Dehydration Patient was placed on IV fluids with improvement in volume status.  At the time of his discharge he is tolerating po well, no nausea or vomiting.   Hyperglycemia Reactive hyperglycemia, has improved, a the time of his discharge his fasting glucose is 89 mg/dl   Macrocytic anemia Remained stable, follow up cell count as outpatient.  Folate and vitamin D within normal limits.    Hypothyroidism Patient was diagnoses with hypothyroidism in the past but stopped taking hormonal replacement therapy.  He has been resume on levothyroxine with good toleration.    Consultants: Neurology over the phone  Procedures performed: none   Disposition: Home Diet recommendation:  Regular diet DISCHARGE MEDICATION: Allergies as of 09/21/2023       Reactions   Morphine And Codeine Itching        Medication List     STOP taking these medications    diphenhydrAMINE 25 mg capsule Commonly known as: BENADRYL       TAKE these medications    chlordiazePOXIDE 5 MG capsule Commonly known as: LIBRIUM Take 1 capsule (5 mg total) by mouth 3 (three) times daily as needed for withdrawal or anxiety.   levothyroxine 25 MCG tablet Commonly known as: SYNTHROID Take 1 tablet (25 mcg total) by mouth daily at 6 (six) AM. Start taking on: September 22, 2023   pantoprazole 40 MG tablet Commonly known as: PROTONIX Take 1 tablet (40 mg total) by mouth daily.        Discharge Exam: Filed Weights   09/19/23 1478 09/19/23 0743  Weight: 74.8 kg 56.6 kg   BP 106/76   Pulse 86   Temp 98.1 F (36.7 C) (Oral)   Resp 18   Ht 5\' 6"  (1.676 m)   Wt 56.6 kg   SpO2 99%   BMI 20.14 kg/m   Patient is feeling better, no chest pain, no abdominal pain, no nausea or vomiting, no tremors or anxiety.   Neurology awake and alert ENT with no pallor or icterus Cardiovascular with S1 and S2 present and regular with no gallops or rubs Respiratory with no rales or rhonchi Abdomen with no distention and non tender No lower extremity edema   Condition at discharge: stable  The results of significant diagnostics from this hospitalization (including imaging, microbiology, ancillary and laboratory) are listed below for reference.   Imaging Studies: EEG adult Result Date: 09/20/2023 Arleene Lack, MD     09/20/2023  4:24 AM Patient Name: [name] MRN: [id number] Epilepsy  Attending: Arleene Lack Referring Physician/Provider: Edson Graces, MD Date: 09/19/2023 Duration: 22.25 mins Patient history: 33yo M with alcohol withdrawal seizure. EEG to evaluate for seizure Level of alertness: Awake AEDs during EEG study: Ativan Technical aspects: This EEG study was done with scalp electrodes positioned according to the 10-20 International system of electrode placement. Electrical activity was reviewed with band pass filter of 1-70Hz , sensitivity of 7 uV/mm, display speed of 77mm/sec with a 60Hz  notched filter applied as appropriate. EEG data were recorded continuously and digitally stored.  Video monitoring was available and reviewed as appropriate. Description: The posterior dominant rhythm consists of 8 Hz activity of moderate voltage (25-35 uV) seen predominantly in posterior head regions, symmetric and reactive to eye opening and eye closing. Physiologic photic driving was seen during photic stimulation.  Hyperventilation was not performed.   EEG was technically difficult due to significant electrode artifact. IMPRESSION: This  technically difficult study is within normal limits. No seizures or epileptiform discharges were seen throughout the recording. A normal interictal EEG does not exclude the diagnosis of epilepsy. Priyanka Suzanne Erps   CT HEAD WO CONTRAST ( ) Result Date: 09/19/2023 CLINICAL DATA:  Initial evaluation for acute head trauma. EXAM: CT HEAD WITHOUT CONTRAST TECHNIQUE: Contiguous axial images were obtained from the base of the skull through the vertex without intravenous contrast. RADIATION DOSE REDUCTION: This exam was performed according to the departmental dose-optimization program which includes automated exposure control, adjustment of the mA and/or kV according to patient size and/or use of iterative reconstruction technique. COMPARISON:  CT from 04/03/2012. FINDINGS: Brain: Cerebral volume within normal limits for age no acute intracranial hemorrhage. No  acute large vessel territory infarct. No mass lesion or midline shift. No hydrocephalus or extra-axial fluid collection. Vascular: No abnormal hyperdense vessel. Skull: Scalp soft tissues demonstrate no acute finding. Calvarium intact. Sinuses/Orbits: Globes orbital soft tissues within normal limits. Visualized paranasal sinuses are clear. No mastoid effusion. Other: None. IMPRESSION: Normal head CT. No acute intracranial abnormality. Electronically Signed   By: Virgia Griffins M.D.   On: 09/19/2023 03:25    Microbiology: Results for orders placed or performed during the hospital encounter of 09/18/23  Resp panel by RT-PCR (RSV, Flu A&B, Covid) Anterior Nasal Swab     Status: None   Collection Time: 09/18/23 10:37 PM   Specimen: Anterior Nasal Swab  Result Value Ref Range Status   SARS Coronavirus 2 by RT PCR NEGATIVE NEGATIVE Final    Comment: (NOTE) SARS-CoV-2 target nucleic acids are NOT DETECTED.  The SARS-CoV-2 RNA is generally detectable in upper respiratory specimens during the acute phase of infection. The lowest concentration of SARS-CoV-2 viral copies this assay can detect is 138 copies/mL. A negative result does not preclude SARS-Cov-2 infection and should not be used as the sole basis for treatment or other patient management decisions. A negative result may occur with  improper specimen collection/handling, submission of specimen other than nasopharyngeal swab, presence of viral mutation(s) within the areas targeted by this assay, and inadequate number of viral copies(<138 copies/mL). A negative result must be combined with clinical observations, patient history, and epidemiological information. The expected result is Negative.  Fact Sheet for Patients:  BloggerCourse.com  Fact Sheet for Healthcare Providers:  SeriousBroker.it  This test is no t yet approved or cleared by the [place]  FDA and  has been authorized  for detection and/or diagnosis of SARS-CoV-2 by FDA under an Emergency Use Authorization (EUA). This EUA will remain  in effect (meaning this test can be used) for the duration of the COVID-19 declaration under Section 564(b)(1) of the Act, 21 U.S.C.section 360bbb-3(b)(1), unless the authorization is terminated  or revoked sooner.       Influenza A by PCR NEGATIVE NEGATIVE Final   Influenza B by PCR NEGATIVE NEGATIVE Final    Comment: (NOTE) The Xpert Xpress SARS-CoV-2/FLU/RSV plus assay is intended as an aid in the diagnosis of influenza from Nasopharyngeal swab specimens and should not be used as a sole basis for treatment. Nasal washings and aspirates are unacceptable for Xpert Xpress SARS-CoV-2/FLU/RSV testing.  Fact Sheet for Patients: BloggerCourse.com  Fact Sheet for Healthcare Providers: SeriousBroker.it  This test is not yet approved or cleared by the [place]  FDA and has been authorized for detection and/or diagnosis of SARS-CoV-2 by FDA under an Emergency Use Authorization (EUA). This EUA will remain in effect (meaning this test can be used) for the  duration of the COVID-19 declaration under Section 564(b)(1) of the Act, 21 U.S.C. section 360bbb-3(b)(1), unless the authorization is terminated or revoked.     Resp Syncytial Virus by PCR NEGATIVE NEGATIVE Final    Comment: (NOTE) Fact Sheet for Patients: BloggerCourse.com  Fact Sheet for Healthcare Providers: SeriousBroker.it  This test is not yet approved or cleared by the [place]  FDA and has been authorized for detection and/or diagnosis of SARS-CoV-2 by FDA under an Emergency Use Authorization (EUA). This EUA will remain in effect (meaning this test can be used) for the duration of the COVID-19 declaration under Section 564(b)(1) of the Act, 21 U.S.C. section 360bbb-3(b)(1), unless the authorization is  terminated or revoked.  Performed at [facility], [address] [place], [place] [address]   MRSA Next Gen by PCR, Nasal     Status: Abnormal   Collection Time: 09/19/23  7:40 AM   Specimen: Nasal Mucosa; Nasal Swab  Result Value Ref Range Status   MRSA by PCR Next Gen DETECTED (A) NOT DETECTED Final    Comment: RESULT CALLED TO, READ BACK BY AND VERIFIED WITH: G ROOS AT 1550 09/19/23 BY A WILSON (NOTE) The GeneXpert MRSA Assay (FDA approved for NASAL specimens only), is one component of a comprehensive MRSA colonization surveillance program. It is not intended to diagnose MRSA infection nor to guide or monitor treatment for MRSA infections. Test performance is not FDA approved in patients less than 27 years old. Performed at [facility], [address], [place], [place] [address]     Labs: CBC: Recent Labs  Lab 09/18/23 2213 09/19/23 0633 09/20/23 0443  WBC 6.4 4.2 3.9*  NEUTROABS 5.3  --   --   HGB 11.8* 10.5* 11.9*  HCT 34.3* 31.0* 36.1*  MCV 100.9* 103.0* 104.0*  PLT 152 117* 100*   Basic Metabolic Panel: Recent Labs  Lab 09/18/23 2213 09/19/23 0633 09/20/23 0443  NA 133* 132* 131*  K 3.8 4.1 3.5  CL 92* 96* 95*  CO2 22 23 23   GLUCOSE 151* 80 89  BUN 6 6 5*  CREATININE 0.70 0.65 0.58*  CALCIUM 10.0 9.0 9.1  MG 2.3 2.2  --   PHOS  --  3.3  --    Liver Function Tests: Recent Labs  Lab 09/18/23 2213 09/19/23 0633 09/20/23 0443  AST 182* 123* 102*  ALT 120* 90* 76*  ALKPHOS 116 91 99  BILITOT 1.0 1.4* 1.0  PROT 8.1 6.9 7.1  ALBUMIN 5.2* 4.2 4.2   CBG: Recent Labs  Lab 09/20/23 0405 09/20/23 0752 09/20/23 1155 09/20/23 1954 09/21/23 0733  GLUCAP 95 84 103* 143* 120*    Discharge time spent: greater than 30 minutes.  Signed: Albertus Alt, MD [facility] 09/21/2023

## 2023-09-21 NOTE — Hospital Course (Addendum)
 [name] was admitted to the hospital with the working diagnosis of alcohol withdrawal syndrome.   33 yo male with the past medical history of alcohol abuse, hypothyroidism, depression and Hodgkin's lymphoma on remission who presented with seizure. Patient has been drinking about 12 pack beer daily for 5 consecutive days. He was seen by his mother to have a seizure and EMS was called. By time time EMS got to his home he had recovered his consciousness.  At the time of his initial physical examination his blood pressure was 123/81, HR 94, RR 16 and 02 saturation 97%.  Awake and alert, lungs with no wheezing or rales, heart with S1 and S2 present and regular, abdomen with no distention and no lower extremity edema.   Na 133, K 3,8 Cl 92 bicarbonate 22, glucose 151, bun 6 cr 0,70  AST 182, ALT 120 T bilirubin 1,0  Wbc 6.4 hgb 11,8 plt 152  TSH 22,9  Free T 4 0,49  Sars covid 19 negative Influenza negative   Urine analysis SG 1,011, protein 100, negative hgb and negative leukocytes.  Toxicology screen positive for benzodiazepines.  Alcohol level < 10   Head CT with no acute changes.  EKG 108 bpm, normal axis, normal intervals, qtc 470, sinus rhythm with no significant ST segment or  T wave changes.   Patient was placed on alcohol withdrawal protocol.   04/11 EEG with no active seizures.  04/12 clinically improved, symptoms controlled with po lorazepam.  Plan to follow up as outpatient.

## 2023-09-21 NOTE — Assessment & Plan Note (Signed)
 Patient was placed on IV fluids with improvement in volume status.  At the time of his discharge he is tolerating po well, no nausea or vomiting.

## 2023-09-21 NOTE — Assessment & Plan Note (Signed)
 Remained stable, follow up cell count as outpatient.  Folate and vitamin D within normal limits.

## 2023-09-21 NOTE — Assessment & Plan Note (Signed)
 Reactive hyperglycemia, has improved, a the time of his discharge his fasting glucose is 89 mg/dl

## 2023-09-21 NOTE — Assessment & Plan Note (Addendum)
 Patient was admitted to the step down unit and placed on alcohol withdrawal protocol, CIWA with as needed benzodiazepines.  Neuro checks and supportive medical therapy.  EEG had no active seizures and neurology recommended no anti epileptic therapy.  Recommendations not to drive until follow up with neurology as outpatient.  Avoid alcohol.   Over the last 24 hrs he has used 3 mg of po lorazepam, and 5 mg bid chlordiazepoxide,  to control his symptoms.  This morning he is feeling well with no tremors or anxiety.  Plan to continue as needed lorazepam at home and close follow up as outpatient.   Elevated liver enzymes due to alcohol abuse, at the time of his discharge AST and ALT trending down with no hyperbilirubinemia. (AST 102, ALT 76, T Bilirubins 1,0)  Will prescribe pantoprazole for the next 14 days for GI prophylaxis.

## 2023-09-21 NOTE — Assessment & Plan Note (Signed)
 Patient was diagnoses with hypothyroidism in the past but stopped taking hormonal replacement therapy.  He has been resume on levothyroxine with good toleration.

## 2023-09-24 ENCOUNTER — Encounter (HOSPITAL_COMMUNITY): Payer: Self-pay

## 2023-09-24 ENCOUNTER — Inpatient Hospital Stay (HOSPITAL_COMMUNITY)
Admission: EM | Admit: 2023-09-24 | Discharge: 2023-09-27 | DRG: 897 | Disposition: A | Discharge status: Home / Self Care | Payer: MEDICAID | Attending: Internal Medicine | Admitting: Internal Medicine

## 2023-09-24 ENCOUNTER — Emergency Department (HOSPITAL_COMMUNITY)
Admission: EM | Admit: 2023-09-24 | Discharge: 2023-09-24 | Discharge status: Against Medical Advice | Payer: MEDICAID | Source: Home / Self Care | Attending: Emergency Medicine | Admitting: Emergency Medicine

## 2023-09-24 ENCOUNTER — Encounter (HOSPITAL_COMMUNITY): Payer: Self-pay | Admitting: Emergency Medicine

## 2023-09-24 ENCOUNTER — Other Ambulatory Visit: Payer: Self-pay

## 2023-09-24 DIAGNOSIS — D539 Nutritional anemia, unspecified: Secondary | ICD-10-CM | POA: Diagnosis present

## 2023-09-24 DIAGNOSIS — C859A Non-Hodgkin lymphoma, unspecified, in remission: Secondary | ICD-10-CM | POA: Diagnosis present

## 2023-09-24 DIAGNOSIS — R7401 Elevation of levels of liver transaminase levels: Secondary | ICD-10-CM | POA: Diagnosis present

## 2023-09-24 DIAGNOSIS — F419 Anxiety disorder, unspecified: Secondary | ICD-10-CM | POA: Diagnosis present

## 2023-09-24 DIAGNOSIS — E039 Hypothyroidism, unspecified: Secondary | ICD-10-CM | POA: Diagnosis present

## 2023-09-24 DIAGNOSIS — R45851 Suicidal ideations: Secondary | ICD-10-CM | POA: Diagnosis present

## 2023-09-24 DIAGNOSIS — W19XXXA Unspecified fall, initial encounter: Secondary | ICD-10-CM | POA: Diagnosis present

## 2023-09-24 DIAGNOSIS — Z79890 Hormone replacement therapy (postmenopausal): Secondary | ICD-10-CM

## 2023-09-24 DIAGNOSIS — F10920 Alcohol use, unspecified with intoxication, uncomplicated: Secondary | ICD-10-CM | POA: Insufficient documentation

## 2023-09-24 DIAGNOSIS — F10229 Alcohol dependence with intoxication, unspecified: Principal | ICD-10-CM | POA: Diagnosis present

## 2023-09-24 DIAGNOSIS — Z634 Disappearance and death of family member: Secondary | ICD-10-CM

## 2023-09-24 DIAGNOSIS — F10929 Alcohol use, unspecified with intoxication, unspecified: Secondary | ICD-10-CM | POA: Diagnosis present

## 2023-09-24 DIAGNOSIS — F32A Depression, unspecified: Secondary | ICD-10-CM | POA: Diagnosis present

## 2023-09-24 DIAGNOSIS — F1094 Alcohol use, unspecified with alcohol-induced mood disorder: Secondary | ICD-10-CM

## 2023-09-24 DIAGNOSIS — F1024 Alcohol dependence with alcohol-induced mood disorder: Secondary | ICD-10-CM | POA: Diagnosis present

## 2023-09-24 DIAGNOSIS — Z716 Tobacco abuse counseling: Secondary | ICD-10-CM

## 2023-09-24 DIAGNOSIS — Z5329 Procedure and treatment not carried out because of patient's decision for other reasons: Secondary | ICD-10-CM | POA: Insufficient documentation

## 2023-09-24 DIAGNOSIS — Y908 Blood alcohol level of 240 mg/100 ml or more: Secondary | ICD-10-CM | POA: Diagnosis present

## 2023-09-24 DIAGNOSIS — F17200 Nicotine dependence, unspecified, uncomplicated: Secondary | ICD-10-CM | POA: Diagnosis present

## 2023-09-24 DIAGNOSIS — F1010 Alcohol abuse, uncomplicated: Principal | ICD-10-CM

## 2023-09-24 LAB — COMPREHENSIVE METABOLIC PANEL WITH GFR
ALT: 176 U/L — ABNORMAL HIGH (ref 0–44)
AST: 225 U/L — ABNORMAL HIGH (ref 15–41)
Albumin: 4.8 g/dL (ref 3.5–5.0)
Alkaline Phosphatase: 107 U/L (ref 38–126)
Anion gap: 14 (ref 5–15)
BUN: 5 mg/dL — ABNORMAL LOW (ref 6–20)
CO2: 23 mmol/L (ref 22–32)
Calcium: 9.4 mg/dL (ref 8.9–10.3)
Chloride: 106 mmol/L (ref 98–111)
Creatinine, Ser: 0.58 mg/dL — ABNORMAL LOW (ref 0.61–1.24)
GFR, Estimated: >60 mL/min (ref >60)
Glucose, Bld: 113 mg/dL — ABNORMAL HIGH (ref 70–99)
Potassium: 4.1 mmol/L (ref 3.5–5.1)
Sodium: 143 mmol/L (ref 135–145)
Total Bilirubin: 0.4 mg/dL (ref 0.0–1.2)
Total Protein: 8 g/dL (ref 6.5–8.1)

## 2023-09-24 LAB — CBC
HCT: 33.3 % — ABNORMAL LOW (ref 39.0–52.0)
Hemoglobin: 10.9 g/dL — ABNORMAL LOW (ref 13.0–17.0)
MCH: 34.3 pg — ABNORMAL HIGH (ref 26.0–34.0)
MCHC: 32.7 g/dL (ref 30.0–36.0)
MCV: 104.7 fL — ABNORMAL HIGH (ref 80.0–100.0)
Platelets: 205 10*3/uL (ref 150–400)
RBC: 3.18 MIL/uL — ABNORMAL LOW (ref 4.22–5.81)
RDW: 14.1 % (ref 11.5–15.5)
WBC: 5.3 10*3/uL (ref 4.0–10.5)
nRBC: 0 % (ref 0.0–0.2)

## 2023-09-24 LAB — ETHANOL: Alcohol, Ethyl (B): 387 mg/dL (ref <10)

## 2023-09-24 NOTE — ED Provider Notes (Signed)
 [place] EMERGENCY DEPARTMENT AT [facility] Provider Note   CSN: [id number] Arrival date & time: 09/24/23  0150     History  Chief Complaint  Patient presents with   Alcohol Intoxication    [name] is a 33 y.o. male.  Patient is a 32 year old male with past medical history of chronic alcohol abuse.  Patient brought by EMS for evaluation of alcohol intoxication and fall.  Patient has minimal ability to describe why he is here due to his level of intoxication.  He denies to me that anything is hurting.  It is my understanding that he may or may not have had some sort of seizure activity and fell.       Home Medications Prior to Admission medications   Medication Sig Start Date End Date Taking? Authorizing Provider  chlordiazePOXIDE (LIBRIUM) 5 MG capsule Take 1 capsule (5 mg total) by mouth 3 (three) times daily as needed for withdrawal or anxiety. 09/21/23   Arrien, York Ram, MD  levothyroxine (SYNTHROID) 25 MCG tablet Take 1 tablet (25 mcg total) by mouth daily at 6 (six) AM. 09/22/23   Arrien, York Ram, MD  pantoprazole (PROTONIX) 40 MG tablet Take 1 tablet (40 mg total) by mouth daily. 09/21/23   Arrien, York Ram, MD      Allergies    Morphine and codeine    Review of Systems   Review of Systems  All other systems reviewed and are negative.   Physical Exam Updated Vital Signs BP (!) 163/101   Pulse (!) 136   Temp 98.1 F (36.7 C) (Oral)   Resp 18   Ht 5\' 6"  (1.676 m)   Wt 56.6 kg   SpO2 96%   BMI 20.14 kg/m  Physical Exam Vitals and nursing note reviewed.  Constitutional:      General: He is not in acute distress.    Appearance: He is well-developed. He is not diaphoretic.     Comments: Patient appears intoxicated.  The odor of alcohol is present and his speech is slurred.  HENT:     Head: Normocephalic and atraumatic.  Cardiovascular:     Rate and Rhythm: Normal rate and regular rhythm.     Heart sounds: No  murmur heard.    No friction rub.  Pulmonary:     Effort: Pulmonary effort is normal. No respiratory distress.     Breath sounds: Normal breath sounds. No wheezing or rales.  Abdominal:     General: Bowel sounds are normal. There is no distension.     Palpations: Abdomen is soft.     Tenderness: There is no abdominal tenderness.  Musculoskeletal:        General: Normal range of motion.     Cervical back: Normal range of motion and neck supple.  Skin:    General: Skin is warm and dry.  Neurological:     Mental Status: He is alert and oriented to person, place, and time.     Coordination: Coordination normal.     ED Results / Procedures / Treatments   Labs (all labs ordered are listed, but only abnormal results are displayed) Labs Reviewed  CBC WITH DIFFERENTIAL/PLATELET  COMPREHENSIVE METABOLIC PANEL WITH GFR  ETHANOL  URINALYSIS, ROUTINE W REFLEX MICROSCOPIC  RAPID URINE DRUG SCREEN, HOSP PERFORMED    EKG None  Radiology No results found.  Procedures Procedures    Medications Ordered in ED Medications - No data to display  ED Course/ Medical Decision Making/  A&P  Patient is a 33 year old male with history of chronic alcoholism.  Patient presenting today by EMS due to an apparent fall.  Patient's history is somewhat incoherent due to his level of intoxication and he is quite uncooperative.  Plan was to obtain laboratory studies and observe.  Shortly after I left the exam room, I was informed by the patient's nurse that the patient is refusing care and wants to sign out.  His mother came to pick him up and will take responsibility for him.    Nothing seems emergent and although patient is intoxicated, I do feel has decision-making capacity to leave.  He was accompanied by his mother who agreed to take responsibility for him.  Final Clinical Impression(s) / ED Diagnoses Final diagnoses:  None    Rx / DC Orders ED Discharge Orders     None         Orvilla Blander, MD 09/24/23 [phone]

## 2023-09-24 NOTE — ED Notes (Signed)
 [employer] at pt bedside talking with pt at this time.

## 2023-09-24 NOTE — ED Triage Notes (Signed)
 Pt BIB [facility] after mom took out IVC papers on pt earlier today. Pt admits to drinking ETOH today. Pt states he has not been taking all of his medications due to him not wanting to take them while he is drinking. Pt denies SI and HI.

## 2023-09-24 NOTE — ED Provider Notes (Signed)
 [place] EMERGENCY DEPARTMENT AT [facility] Provider Note   CSN: [id number] Arrival date & time: 09/24/23  2057     History {Add pertinent medical, surgical, social history, OB history to HPI:1} Chief Complaint  Patient presents with   IVC    [name] is a 33 y.o. male.  HPI Patient presents for***.  Medical history includes alcohol abuse, depression, lymphoma.  He was seen in the ED early this morning for a fall in the setting of alcohol intoxication.  He refused care and was picked up by his mother.  His mother subsequently filed IVC papers due to his severe alcohol abuse and not taking care of himself.***    Home Medications Prior to Admission medications   Medication Sig Start Date End Date Taking? Authorizing Provider  chlordiazePOXIDE (LIBRIUM) 5 MG capsule Take 1 capsule (5 mg total) by mouth 3 (three) times daily as needed for withdrawal or anxiety. 09/21/23   Arrien, York Ram, MD  levothyroxine (SYNTHROID) 25 MCG tablet Take 1 tablet (25 mcg total) by mouth daily at 6 (six) AM. 09/22/23   Arrien, York Ram, MD  pantoprazole (PROTONIX) 40 MG tablet Take 1 tablet (40 mg total) by mouth daily. 09/21/23   Arrien, York Ram, MD      Allergies    Morphine and codeine    Review of Systems   Review of Systems  Physical Exam Updated Vital Signs BP (!) 155/100   Pulse (!) 136   Temp 99.5 F (37.5 C) (Oral)   Resp 19   Ht 5\' 6"  (1.676 m)   Wt 56.6 kg   SpO2 90%   BMI 20.14 kg/m  Physical Exam  ED Results / Procedures / Treatments   Labs (all labs ordered are listed, but only abnormal results are displayed) Labs Reviewed  COMPREHENSIVE METABOLIC PANEL WITH GFR - Abnormal; Notable for the following components:      Result Value   Glucose, Bld 113 (*)    BUN 5 (*)    Creatinine, Ser 0.58 (*)    AST 225 (*)    ALT 176 (*)    All other components within normal limits  ETHANOL - Abnormal; Notable for the following components:    Alcohol, Ethyl (B) 387 (*)    All other components within normal limits  CBC - Abnormal; Notable for the following components:   RBC 3.18 (*)    Hemoglobin 10.9 (*)    HCT 33.3 (*)    MCV 104.7 (*)    MCH 34.3 (*)    All other components within normal limits  RAPID URINE DRUG SCREEN, HOSP PERFORMED    EKG None  Radiology No results found.  Procedures Procedures  {Document cardiac monitor, telemetry assessment procedure when appropriate:1}  Medications Ordered in ED Medications - No data to display  ED Course/ Medical Decision Making/ A&P   {   Click here for ABCD2, HEART and other calculatorsREFRESH Note before signing :1}                              Medical Decision Making Amount and/or Complexity of Data Reviewed Labs: ordered.   ***  {Document critical care time when appropriate:1} {Document review of labs and clinical decision tools ie heart score, Chads2Vasc2 etc:1}  {Document your independent review of radiology images, and any outside records:1} {Document your discussion with family members, caretakers, and with consultants:1} {Document social determinants of health  affecting pt's care:1} {Document your decision making why or why not admission, treatments were needed:1} Final Clinical Impression(s) / ED Diagnoses Final diagnoses:  None    Rx / DC Orders ED Discharge Orders     None

## 2023-09-24 NOTE — ED Notes (Signed)
 Pt left AMA with mom. Pt's mom came to pick up pt and assumed responsibility for his care.

## 2023-09-24 NOTE — ED Notes (Signed)
 Pt belongings in locker 9. Bag has shoes and clothes in there.

## 2023-09-24 NOTE — ED Triage Notes (Signed)
 Pt arrived from home via [facility] s/p fall approx 4 or 5 hours ago. ETOH on board. Pt states he has drank a lot.

## 2023-09-24 NOTE — ED Notes (Addendum)
 Pt states, "going to walk out, I'm not under arrest" pt moved from hallway into room 16, pt asking to speak with Dr, "hasn't done anything wrong and wants to go home" Security called and present at bedside at this time. Dr Randal Bury made aware. Pt has IVC paperwork that he came with taken out by his mom for drinking heavily, not taking care of self.

## 2023-09-25 DIAGNOSIS — Z634 Disappearance and death of family member: Secondary | ICD-10-CM | POA: Diagnosis not present

## 2023-09-25 DIAGNOSIS — Z716 Tobacco abuse counseling: Secondary | ICD-10-CM | POA: Diagnosis not present

## 2023-09-25 DIAGNOSIS — C859A Non-Hodgkin lymphoma, unspecified, in remission: Secondary | ICD-10-CM | POA: Diagnosis present

## 2023-09-25 DIAGNOSIS — F1024 Alcohol dependence with alcohol-induced mood disorder: Secondary | ICD-10-CM | POA: Diagnosis present

## 2023-09-25 DIAGNOSIS — F10920 Alcohol use, unspecified with intoxication, uncomplicated: Secondary | ICD-10-CM | POA: Diagnosis not present

## 2023-09-25 DIAGNOSIS — F10929 Alcohol use, unspecified with intoxication, unspecified: Secondary | ICD-10-CM | POA: Diagnosis present

## 2023-09-25 DIAGNOSIS — F419 Anxiety disorder, unspecified: Secondary | ICD-10-CM | POA: Diagnosis present

## 2023-09-25 DIAGNOSIS — F1094 Alcohol use, unspecified with alcohol-induced mood disorder: Secondary | ICD-10-CM | POA: Diagnosis not present

## 2023-09-25 DIAGNOSIS — F17200 Nicotine dependence, unspecified, uncomplicated: Secondary | ICD-10-CM | POA: Diagnosis present

## 2023-09-25 DIAGNOSIS — Z79890 Hormone replacement therapy (postmenopausal): Secondary | ICD-10-CM | POA: Diagnosis not present

## 2023-09-25 DIAGNOSIS — R45851 Suicidal ideations: Secondary | ICD-10-CM | POA: Diagnosis present

## 2023-09-25 DIAGNOSIS — E039 Hypothyroidism, unspecified: Secondary | ICD-10-CM | POA: Diagnosis present

## 2023-09-25 DIAGNOSIS — D539 Nutritional anemia, unspecified: Secondary | ICD-10-CM | POA: Diagnosis present

## 2023-09-25 DIAGNOSIS — R7401 Elevation of levels of liver transaminase levels: Secondary | ICD-10-CM | POA: Diagnosis present

## 2023-09-25 DIAGNOSIS — F10229 Alcohol dependence with intoxication, unspecified: Secondary | ICD-10-CM | POA: Diagnosis present

## 2023-09-25 DIAGNOSIS — W19XXXA Unspecified fall, initial encounter: Secondary | ICD-10-CM | POA: Diagnosis present

## 2023-09-25 DIAGNOSIS — Y908 Blood alcohol level of 240 mg/100 ml or more: Secondary | ICD-10-CM | POA: Diagnosis present

## 2023-09-25 DIAGNOSIS — F32A Depression, unspecified: Secondary | ICD-10-CM | POA: Diagnosis present

## 2023-09-25 LAB — COMPREHENSIVE METABOLIC PANEL WITH GFR
ALT: 134 U/L — ABNORMAL HIGH (ref 0–44)
AST: 156 U/L — ABNORMAL HIGH (ref 15–41)
Albumin: 3.9 g/dL (ref 3.5–5.0)
Alkaline Phosphatase: 85 U/L (ref 38–126)
Anion gap: 10 (ref 5–15)
BUN: 5 mg/dL — ABNORMAL LOW (ref 6–20)
CO2: 23 mmol/L (ref 22–32)
Calcium: 8.9 mg/dL (ref 8.9–10.3)
Chloride: 104 mmol/L (ref 98–111)
Creatinine, Ser: 0.42 mg/dL — ABNORMAL LOW (ref 0.61–1.24)
GFR, Estimated: >60 mL/min (ref >60)
Glucose, Bld: 88 mg/dL (ref 70–99)
Potassium: 3.7 mmol/L (ref 3.5–5.1)
Sodium: 137 mmol/L (ref 135–145)
Total Bilirubin: 0.4 mg/dL (ref 0.0–1.2)
Total Protein: 6.7 g/dL (ref 6.5–8.1)

## 2023-09-25 LAB — CBC
HCT: 29.2 % — ABNORMAL LOW (ref 39.0–52.0)
Hemoglobin: 9.7 g/dL — ABNORMAL LOW (ref 13.0–17.0)
MCH: 34.5 pg — ABNORMAL HIGH (ref 26.0–34.0)
MCHC: 33.2 g/dL (ref 30.0–36.0)
MCV: 103.9 fL — ABNORMAL HIGH (ref 80.0–100.0)
Platelets: 164 10*3/uL (ref 150–400)
RBC: 2.81 MIL/uL — ABNORMAL LOW (ref 4.22–5.81)
RDW: 14.2 % (ref 11.5–15.5)
WBC: 4.1 10*3/uL (ref 4.0–10.5)
nRBC: 0 % (ref 0.0–0.2)

## 2023-09-25 LAB — RAPID URINE DRUG SCREEN, HOSP PERFORMED
Amphetamines: NOT DETECTED
Barbiturates: NOT DETECTED
Benzodiazepines: POSITIVE — AB
Cocaine: NOT DETECTED
Opiates: NOT DETECTED
Tetrahydrocannabinol: NOT DETECTED

## 2023-09-25 LAB — MAGNESIUM
Magnesium: 2.1 mg/dL (ref 1.7–2.4)
Magnesium: 1.8 mg/dL (ref 1.7–2.4)

## 2023-09-25 LAB — VITAMIN B12: Vitamin B-12: 232 pg/mL (ref 180–914)

## 2023-09-25 LAB — FOLATE: Folate: 7.6 ng/mL (ref >5.9)

## 2023-09-25 MED ORDER — LEVOTHYROXINE SODIUM 25 MCG PO TABS
25.0000 ug | ORAL_TABLET | Freq: Every day | ORAL | Status: DC
Start: 2023-09-26 — End: 2023-09-26
  Administered 2023-09-26: 25 ug via ORAL
  Filled 2023-09-25: qty 1

## 2023-09-25 MED ORDER — ENOXAPARIN SODIUM 40 MG/0.4ML IJ SOSY
40.0000 mg | PREFILLED_SYRINGE | INTRAMUSCULAR | Status: DC
  Administered 2023-09-26 – 2023-09-27 (×2): 40 mg via SUBCUTANEOUS
  Filled 2023-09-25 (×3): qty 0.4

## 2023-09-25 MED ORDER — LORAZEPAM 1 MG PO TABS: 0.0000 mg | ORAL_TABLET | Freq: Two times a day (BID) | ORAL | Status: DC

## 2023-09-25 MED ORDER — LACTATED RINGERS IV BOLUS
2000.0000 mL | Freq: Once | INTRAVENOUS | Status: AC
Start: 2023-09-25 — End: 2023-09-25
  Administered 2023-09-25: 2000 mL via INTRAVENOUS

## 2023-09-25 MED ORDER — CHLORDIAZEPOXIDE HCL 25 MG PO CAPS: 25.0000 mg | ORAL_CAPSULE | ORAL | Status: DC

## 2023-09-25 MED ORDER — ADULT MULTIVITAMIN W/MINERALS CH
1.0000 | ORAL_TABLET | Freq: Every day | ORAL | Status: DC
  Administered 2023-09-25 – 2023-09-27 (×3): 1 via ORAL
  Filled 2023-09-25 (×3): qty 1

## 2023-09-25 MED ORDER — HYDRALAZINE HCL 20 MG/ML IJ SOLN
5.0000 mg | Freq: Four times a day (QID) | INTRAMUSCULAR | Status: DC | PRN
  Administered 2023-09-25: 5 mg via INTRAVENOUS
  Filled 2023-09-25: qty 1

## 2023-09-25 MED ORDER — ONDANSETRON HCL 4 MG PO TABS: 4.0000 mg | ORAL_TABLET | Freq: Four times a day (QID) | ORAL | Status: DC | PRN

## 2023-09-25 MED ORDER — CHLORDIAZEPOXIDE HCL 25 MG PO CAPS: 25.0000 mg | ORAL_CAPSULE | Freq: Four times a day (QID) | ORAL | Status: DC | PRN

## 2023-09-25 MED ORDER — LORAZEPAM 2 MG/ML IJ SOLN: 0.0000 mg | Freq: Two times a day (BID) | INTRAMUSCULAR | Status: DC

## 2023-09-25 MED ORDER — THIAMINE MONONITRATE 100 MG PO TABS
100.0000 mg | ORAL_TABLET | Freq: Every day | ORAL | Status: DC
Start: 2023-09-25 — End: 2023-09-28
  Administered 2023-09-25 – 2023-09-27 (×3): 100 mg via ORAL
  Filled 2023-09-25 (×2): qty 1

## 2023-09-25 MED ORDER — LOPERAMIDE HCL 2 MG PO CAPS
2.0000 mg | ORAL_CAPSULE | ORAL | Status: DC | PRN
  Administered 2023-09-27: 4 mg via ORAL
  Filled 2023-09-25: qty 2

## 2023-09-25 MED ORDER — HYDROXYZINE HCL 25 MG PO TABS
25.0000 mg | ORAL_TABLET | Freq: Four times a day (QID) | ORAL | Status: DC | PRN
  Administered 2023-09-27: 25 mg via ORAL
  Filled 2023-09-25: qty 1

## 2023-09-25 MED ORDER — THIAMINE MONONITRATE 100 MG PO TABS
100.0000 mg | ORAL_TABLET | Freq: Every day | ORAL | Status: DC
  Administered 2023-09-25: 100 mg via ORAL
  Filled 2023-09-25 (×2): qty 1

## 2023-09-25 MED ORDER — CHLORDIAZEPOXIDE HCL 25 MG PO CAPS
25.0000 mg | ORAL_CAPSULE | Freq: Four times a day (QID) | ORAL | Status: AC
  Administered 2023-09-25 – 2023-09-26 (×6): 25 mg via ORAL
  Filled 2023-09-25 (×6): qty 1

## 2023-09-25 MED ORDER — CHLORDIAZEPOXIDE HCL 25 MG PO CAPS
25.0000 mg | ORAL_CAPSULE | Freq: Three times a day (TID) | ORAL | Status: DC
Start: 2023-09-27 — End: 2023-09-28
  Administered 2023-09-27 (×2): 25 mg via ORAL
  Filled 2023-09-25 (×2): qty 1

## 2023-09-25 MED ORDER — LORAZEPAM 2 MG/ML IJ SOLN
0.0000 mg | Freq: Two times a day (BID) | INTRAMUSCULAR | Status: DC
Start: 2023-09-25 — End: 2023-09-27
  Administered 2023-09-25: 1 mg via INTRAVENOUS
  Filled 2023-09-25: qty 1

## 2023-09-25 MED ORDER — LORAZEPAM 1 MG PO TABS
0.0000 mg | ORAL_TABLET | Freq: Four times a day (QID) | ORAL | Status: DC
  Administered 2023-09-25: 1 mg via ORAL
  Filled 2023-09-25 (×2): qty 2

## 2023-09-25 MED ORDER — THIAMINE HCL 100 MG/ML IJ SOLN
100.0000 mg | Freq: Once | INTRAMUSCULAR | Status: AC
  Administered 2023-09-25: 100 mg via INTRAMUSCULAR
  Filled 2023-09-25: qty 2

## 2023-09-25 MED ORDER — LORAZEPAM 2 MG/ML IJ SOLN: 0.0000 mg | Freq: Four times a day (QID) | INTRAMUSCULAR | Status: DC

## 2023-09-25 MED ORDER — ONDANSETRON HCL 4 MG/2ML IJ SOLN: 4.0000 mg | Freq: Four times a day (QID) | INTRAMUSCULAR | Status: DC | PRN

## 2023-09-25 MED ORDER — THIAMINE HCL 100 MG/ML IJ SOLN
100.0000 mg | Freq: Every day | INTRAMUSCULAR | Status: DC
  Filled 2023-09-25: qty 2

## 2023-09-25 MED ORDER — CHLORDIAZEPOXIDE HCL 25 MG PO CAPS: 25.0000 mg | ORAL_CAPSULE | Freq: Every day | ORAL | Status: DC

## 2023-09-25 MED ORDER — MAGNESIUM SULFATE 2 GM/50ML IV SOLN
2.0000 g | Freq: Once | INTRAVENOUS | Status: AC
  Administered 2023-09-25: 2 g via INTRAVENOUS
  Filled 2023-09-25: qty 50

## 2023-09-25 MED ORDER — NICOTINE 21 MG/24HR TD PT24
21.0000 mg | MEDICATED_PATCH | Freq: Once | TRANSDERMAL | Status: AC
  Administered 2023-09-25: 21 mg via TRANSDERMAL
  Filled 2023-09-25: qty 1

## 2023-09-25 NOTE — Plan of Care (Signed)

## 2023-09-25 NOTE — ED Notes (Signed)
 IVC PAPERWORK FAXED TO [facility] @ [phone], [phone] & [phone]

## 2023-09-25 NOTE — ED Notes (Signed)
 Pt given water and Gatorade for rehydration

## 2023-09-25 NOTE — ED Notes (Signed)
 Pt given new testament Bible to read if desired. Pt expressed gratitude.

## 2023-09-25 NOTE — ED Notes (Signed)
 Pt up to bathroom, ambulatory with sitter,

## 2023-09-25 NOTE — ED Notes (Signed)
 Pt transported to floor with belongings and IVC paperwork.

## 2023-09-25 NOTE — Consult Note (Signed)
 Pt has been medically admitted for alcohol detox.   Please reach out to TTS for reevaluation when patient is medically cleared/stable for disposition or discharge.

## 2023-09-25 NOTE — BH Assessment (Signed)
 Comprehensive Clinical Assessment (CCA) Note  09/25/2023 [name] [id number]  Disposition:  Per Sindy Guadeloupe, NP, patient is recommended for medical detoxification.    The patient demonstrates the following risk factors for suicide: Chronic risk factors for suicide include: psychiatric disorder of alcohol induced mood disorder and substance use disorder. Acute risk factors for suicide include: loss (financial, interpersonal, professional). Protective factors for this patient include: positive social support. Considering these factors, the overall suicide risk at this point appears to be low. Patient is not appropriate for outpatient follow up due to his need for detox  AIMS    Flowsheet Row [facility]  AIMS Total Score 0      AUDIT    Flowsheet Row [facility]  Alcohol Use Disorder Identification Test Final Score (AUDIT) 14      Flowsheet Row ED from 09/24/2023 in [facility] at [facility] Most recent reading at 09/24/2023  9:05 PM ED from 09/24/2023 in [facility] Department at [facility] Most recent reading at 09/24/2023  2:07 AM ED to Hosp-Admission (Discharged) from 09/18/2023 in [facility] Most recent reading at 09/18/2023  9:44 PM  C-SSRS RISK CATEGORY No Risk No Risk No Risk        Chief Complaint:  Chief Complaint  Patient presents with   IVC   Alcohol Problem   Suicidal   Visit Diagnosis: F10.94 Alcohol Induced Mood Disorder, F10.20 Alcohol Use Disorder Severe    CCA Screening, Triage and Referral (STR)  Patient Reported Information How did you hear about [place]? Legal System (IVC petitioned by his mother)  What Is the Reason for Your Visit/Call Today? Per EDP Note: Patient presents for suicidal ideation.  Medical history includes alcohol abuse,  depression, lymphoma.  He was seen in the ED early this morning for a fall in the setting of alcohol intoxication.  He refused care and was picked up by his mother.  His mother subsequently filed IVC papers due to his severe alcohol abuse as well as statements of suicidal ideation and not taking care of himself.  Patient denies this.  Patient also denies any physical complaints.  Mother states that he drinks 12 beers per day.  Patient states that is typically less than that.  His last drink was at 7:30 PM.  He typically starts drinking in the day around noon.  Patient admits to a history of daily use of alcohol drinking up to a case of beer daily, but states that most recently, he states that he  has only been drinking 7-8 beers twice weekly, however, he states that he drank 14 beers tonight and had a BAL of 387 upon admission to the ED.  Patient states that he has a history of withdrawal symptoms of tremors, sweats, chill, NVD.  Patient states that he completed the [facility] in the past.  His longest period of sobriety has been 90 days.  He states that he was incarcerated in the past and was sober while in jail.  However, he has not been able to stay sober outside of a controlled environment.  Patient denies that he is suicidal.  He states that his mother misunderstood what he was saying.  Patient states that he has been struggling since his father's death two years ago.  Patient states that he has never tried to harm himself and  he never would.  He denies HI/Psychosis.  He states that his sleep and appetite have been good.  He denies any history of abuse or self-mutilating behaviors.  Patient states that he quit school in the eleventh grade.  He has not worked in eight years due to a neck/spine injury. He also had Hodgkin's Lymphoma in the past. Patient states that he is applying for disability. He has never been married and he states that he has no children. He denies having any current legal  issues.  Assessment Counselor spoke to patient's mother, Loomis Anacker [phone], with his verbal permission.She states that patient has not handled the loss of his father very well.  She states that he has been drinking at least fifteen beers daily.  He has had 3-4 seizures in the past week.  She states that he is depressed and told her tonight that he would just go into the basement and have a seizure and die.  She states that he has not been eating well, not taking his medication and not bathing as much as usual.  She states that he has not worked in eight years.  She feels like he is a danger to himself.  Patient is alert and oriented.  His mod is depressed.  His judgment, insight and impulse control are impaired.  His thoughts are organized and his memory intact.  He does not appear to be responding to any internal stimuli.  His speech is mildly slurred.  His eye contact avoided.  How Long Has This Been Causing You Problems? > than 6 months  What Do You Feel Would Help You the Most Today? Alcohol or Drug Use Treatment   Have You Recently Had Any Thoughts About Hurting Yourself? No (thoughts of having a seizure and dying)  Are You Planning to Commit Suicide/Harm Yourself At This time? No   Flowsheet Row ED from 09/24/2023 in [facility] at [facility] Most recent reading at 09/24/2023  9:05 PM ED from 09/24/2023 in [facility] Emergency Department at [facility] Most recent reading at 09/24/2023  2:07 AM ED to Hosp-Admission (Discharged) from 09/18/2023 in [facility] Most recent reading at 09/18/2023  9:44 PM  C-SSRS RISK CATEGORY No Risk No Risk No Risk       Have you Recently Had Thoughts About Hurting Someone Marigene Shoulder? No  Are You Planning to Harm Someone at This Time? No  Explanation: NA   Have You Used Any Alcohol or Drugs in the Past 24 Hours? Yes  How Long Ago Did You Use Drugs or Alcohol? last pm  What Did You Use and  How Much? Drank 15 beers, BAL 387   Do You Currently Have a Therapist/Psychiatrist? No  Name of Therapist/Psychiatrist:    Have You Been Recently Discharged From Any Office Practice or Programs? No  Explanation of Discharge From Practice/Program: No data recorded    CCA Screening Triage Referral Assessment Type of Contact: Tele-Assessment  Telemedicine Service Delivery:   Is this Initial or Reassessment? Is this Initial or Reassessment?: Initial Assessment  Date Telepsych consult ordered in [facility]:  Date Telepsych consult ordered in [facility]: 09/25/23  Time Telepsych consult ordered in [facility]:  Time Telepsych consult ordered in [facility]: 0050  Location of Assessment: [facility] ED  Provider Location: [facility] Assessment Services   Collateral Involvement: Mother was contacted with patient's permission. Stefen Juba [phone]   Does Patient Have a Court Appointed Legal Guardian? No  Legal Guardian  Contact Information: NA  Copy of Legal Guardianship Form: -- (NA)  Legal Guardian Notified of Arrival: -- (NA)  Legal Guardian Notified of Pending Discharge: -- (NA)  If Minor and Not Living with Parent(s), Who has Custody? NA  Is CPS involved or ever been involved? Never  Is APS involved or ever been involved? Never   Patient Determined To Be At Risk for Harm To Self or Others Based on Review of Patient Reported Information or Presenting Complaint? No  Method: No Plan  Availability of Means: No access or NA  Intent: Vague intent or NA  Notification Required: Identifiable person is aware (self)  Additional Information for Danger to Others Potential: -- (none reported)  Additional Comments for Danger to Others Potential: none reported  Are There Guns or Other Weapons in Your Home? Yes  Types of Guns/Weapons: 12 guage shotgun  Are These Weapons Safely Secured?                            No  Who Could Verify You Are Able To Have These Secured: Asked mother to remove gun from  home  Do You Have any Outstanding Charges, Pending Court Dates, Parole/Probation? none reported  Contacted To Inform of Risk of Harm To Self or Others: Other: Comment (NA)    Does Patient Present under Involuntary Commitment? Yes    [place] of Residence: [place]   Patient Currently Receiving the Following Services: Not Receiving Services   Determination of Need: Urgent (48 hours)   Options For Referral: Other: Comment (Medical Detox Recommended)     CCA Biopsychosocial Patient Reported Schizophrenia/Schizoaffective Diagnosis in Past: No   Strengths: Desire for help   Mental Health Symptoms Depression:  Change in energy/activity; Sleep (too much or little)   Duration of Depressive symptoms: Duration of Depressive Symptoms: Greater than two weeks   Mania:  None   Anxiety:   None   Psychosis:  None   Duration of Psychotic symptoms:    Trauma:  None   Obsessions:  None   Compulsions:  None   Inattention:  None   Hyperactivity/Impulsivity:  None   Oppositional/Defiant Behaviors:  None   Emotional Irregularity:  N/A   Other Mood/Personality Symptoms:  depressed mood    Mental Status Exam Appearance and self-care  Stature:  Average   Weight:  Average weight   Clothing:  Casual   Grooming:  Neglected   Cosmetic use:  None   Posture/gait:  Normal   Motor activity:  Restless   Sensorium  Attention:  Normal   Concentration:  Normal   Orientation:  Object; Person; Place; Situation; Time   Recall/memory:  Normal   Affect and Mood  Affect:  Depressed; Anxious   Mood:  Anxious; Depressed   Relating  Eye contact:  Avoided   Facial expression:  Depressed   Attitude toward examiner:  Cooperative   Thought and Language  Speech flow: Slurred (BAL 387)   Thought content:  Appropriate to Mood and Circumstances   Preoccupation:  Other (Comment) (none reported)   Hallucinations:  None   Organization:  Circumstantial   Printmaker of Knowledge:  Average   Intelligence:  Average   Abstraction:  Normal   Judgement:  Impaired   Reality Testing:  Distorted   Insight:  Fair   Decision Making:  Impulsive   Social Functioning  Social Maturity:  Impulsive   Social Judgement:  Normal   Stress  Stressors:  Surveyor, quantity; Grief/losses   Coping Ability:  Human resources officer Deficits:  Decision making   Supports:  Family     Religion: Religion/Spirituality Are You A Religious Person?:  (not assessed) How Might This Affect Treatment?: NA  Leisure/Recreation: Leisure / Recreation Do You Have Hobbies?: No  Exercise/Diet: Exercise/Diet Do You Exercise?: No Do You Follow a Special Diet?: No Do You Have Any Trouble Sleeping?: No   CCA Employment/Education Employment/Work Situation: Employment / Work Situation Employment Situation: Unemployed Patient's Job has Been Impacted by Current Illness: No Has Patient ever Been in Equities trader?: No  Education: Education Is Patient Currently Attending School?: No Last Grade Completed:  (GED) Did You Product manager?: No Did You Have An Individualized Education Program (IIEP): No Did You Have Any Difficulty At School?: No Patient's Education Has Been Impacted by Current Illness: No   CCA Family/Childhood History Family and Relationship History: Family history Marital status: Single Does patient have children?: No  Childhood History:  Childhood History By whom was/is the patient raised?: Both parents Did patient suffer any verbal/emotional/physical/sexual abuse as a child?: No Did patient suffer from severe childhood neglect?: No Has patient ever been sexually abused/assaulted/raped as an adolescent or adult?: No Was the patient ever a victim of a crime or a disaster?: No Witnessed domestic violence?: No Has patient been affected by domestic violence as an adult?: No       CCA Substance Use Alcohol/Drug Use: Alcohol / Drug  Use Pain Medications: none reported  Prescriptions: none reported Over the Counter: none reported History of alcohol / drug use?: Yes Longest period of sobriety (when/how long): 9 mos while incarcerated Negative Consequences of Use: Personal relationships Withdrawal Symptoms: Diarrhea, Nausea / Vomiting, Patient aware of relationship between substance abuse and physical/medical complications, Tremors, Other (Comment) (Seizure) Substance #1 Name of Substance 1: alcohol 1 - Age of First Use: not assessed 1 - Amount (size/oz): 15 beers 1 - Frequency: daily 1 - Duration: not assessed 1 - Last Use / Amount: 15 beers 1 - Method of Aquiring: unknown 1- Route of Use: oral                       ASAM's:  Six Dimensions of Multidimensional Assessment  Dimension 1:  Acute Intoxication and/or Withdrawal Potential:   Dimension 1:  Description of individual's past and current experiences of substance use and withdrawal: Patient has a history alcohol withdrawal symptoms including seizures  Dimension 2:  Biomedical Conditions and Complications:   Dimension 2:  Description of patient's biomedical conditions and  complications: Patient has a history of seizures and hodgkin's lymphoma  Dimension 3:  Emotional, Behavioral, or Cognitive Conditions and Complications:  Dimension 3:  Description of emotional, behavioral, or cognitive conditions and complications: Patient is situationally depressed with unresolved grief issues  Dimension 4:  Readiness to Change:  Dimension 4:  Description of Readiness to Change criteria: Patient has reservations concerning quitting drinking  Dimension 5:  Relapse, Continued use, or Continued Problem Potential:  Dimension 5:  Relapse, continued use, or continued problem potential critiera description: Patient has failed treatment attempts  He has only been able to stay sober while intoxicated  Dimension 6:  Recovery/Living Environment:  Dimension 6:  Recovery/Iiving  environment criteria description: Patient lives in a safe and supportive environment  ASAM Severity Score: ASAM's Severity Rating Score: 13  ASAM Recommended Level of Treatment: ASAM Recommended Level of Treatment: Level III Residential Treatment   Substance use  Disorder (SUD) Substance Use Disorder (SUD)  Checklist Symptoms of Substance Use: Continued use despite having a persistent/recurrent physical/psychological problem caused/exacerbated by use, Continued use despite persistent or recurrent social, interpersonal problems, caused or exacerbated by use, Evidence of withdrawal (Comment), Large amounts of time spent to obtain, use or recover from the substance(s), Presence of craving or strong urge to use, Substance(s) often taken in larger amounts or over longer times than was intended, Social, occupational, recreational activities given up or reduced due to use, Recurrent use that results in a failure to fulfill major role obligations (work, school, home)  Recommendations for Services/Supports/Treatments: Recommendations for Services/Supports/Treatments Recommendations For Services/Supports/Treatments: Residential-Level 3  Disposition Recommendation per psychiatric provider: Plan Post Discharge/Psychiatric Care Follow-up resources  Patient  requires medical detoxification possibly followed by residential substance abuse treatment   DSM5 Diagnoses: Patient Active Problem List   Diagnosis Date Noted   Alcohol-induced mood disorder (HCC) 09/25/2023   Hypothyroidism 09/21/2023   Alcohol withdrawal seizure (HCC) 09/19/2023   Dehydration 09/19/2023   Transaminitis 09/19/2023   Elevated TSH 09/19/2023   Macrocytic anemia 09/19/2023   Hyperglycemia 09/19/2023   Substance induced mood disorder (HCC) 03/21/2015   Alcohol use disorder, severe, dependence (HCC) 03/20/2015     Referrals to Alternative Service(s): Referred to Alternative Service(s):   Place:   Date:   Time:    Referred to  Alternative Service(s):   Place:   Date:   Time:    Referred to Alternative Service(s):   Place:   Date:   Time:    Referred to Alternative Service(s):   Place:   Date:   Time:     Tzivia Oneil J Gaetano Romberger, LCAS

## 2023-09-25 NOTE — H&P (Signed)
 History and Physical    Patient: [name]:[id number] DOB: 1990-09-13 DOA: 09/24/2023 DOS: the patient was seen and examined on 09/25/2023 PCP: Pcp, No  Patient coming from: Home  Chief Complaint:  Chief Complaint  Patient presents with   IVC   Alcohol Problem   Suicidal   HPI: [name] is a 33 y.o. male with medical history significant of  alcohol abuse, hypothyroidism, Hodgkin's lymphoma (in remission) and depression who presents to the emergency department due to alcohol intoxication.  Patient was initially seen in the early morning of 09/24/23 in the ED due to alcohol intoxication and fall, but shortly after arrival to the ED, he signed AMA and a small and his mom picked him up and took him home.  He presented to the emergency department around 9 PM after his mother filed IVC papers due to patient's severe alcohol abuse and statements of suicidal ideation.  Patient denies any suicidal ideas or plan.  His mother states that he drinks 12 beers daily and his last drink was 7:30 PM.  He was recently admitted from 4/9 to 4/12 due to alcohol withdrawal seizure.  Patient was placed on CIWA protocol and EEG done showed no active seizures.  ED Course:  In the emergency department, patient was tachycardic, BP was 155/100, other vital signs were within normal range.  Workup in the ED showed macrocytic anemia, BMP was normal except for blood glucose of 113, BUN 5, creatinine 0.58, AST 225, ALT 176, alcohol level 387, urine drug screen was positive for benzodiazepine. Patient was evaluated by telepsych and recommended patient for medical detoxification.  Patient was started on CIWA protocol.  Hospitalist was asked to admit patient for further evaluation and management.  Review of Systems: Review of systems as noted in the HPI. All other systems reviewed and are negative.   Past Medical History:  Diagnosis Date   Alcohol abuse    Depression    Lymphoma (HCC)    Thyroid  disease    Past Surgical History:  Procedure Laterality Date   APPENDECTOMY      Social History:  reports that he has been smoking. He does not have any smokeless tobacco history on file. He reports current alcohol use. He reports current drug use.   Allergies  Allergen Reactions   Morphine And Codeine Itching    History reviewed. No pertinent family history.   Prior to Admission medications   Medication Sig Start Date End Date Taking? Authorizing Provider  chlordiazePOXIDE (LIBRIUM) 5 MG capsule Take 1 capsule (5 mg total) by mouth 3 (three) times daily as needed for withdrawal or anxiety. 09/21/23   Arrien, Mauricio Daniel, MD  levothyroxine (SYNTHROID) 25 MCG tablet Take 1 tablet (25 mcg total) by mouth daily at 6 (six) AM. 09/22/23   Arrien, Curlee Doss, MD  pantoprazole (PROTONIX) 40 MG tablet Take 1 tablet (40 mg total) by mouth daily. 09/21/23   Albertus Alt, MD    Physical Exam: BP 138/85   Pulse (!) 101   Temp 99 F (37.2 C)   Resp 18   Ht 5\' 6"  (1.676 m)   Wt 56.6 kg   SpO2 96%   BMI 20.14 kg/m   General: 33 y.o. year-old male well developed well nourished in no acute distress.  Alert and oriented x3. HEENT: NCAT, EOMI Neck: Supple, trachea medial Cardiovascular: Tachycardia.  Regular rate and rhythm with no rubs or gallops.  No thyromegaly or JVD noted.  No lower extremity  edema. 2/4 pulses in all 4 extremities. Respiratory: Clear to auscultation with no wheezes or rales. Good inspiratory effort. Abdomen: Soft, nontender nondistended with normal bowel sounds x4 quadrants. Muskuloskeletal: No cyanosis, clubbing or edema noted bilaterally Neuro: CN II-XII intact, strength 5/5 x 4, sensation, reflexes intact Skin: No ulcerative lesions noted or rashes Psychiatry: Judgement and insight appear normal. Mood is appropriate for condition and setting          Labs on Admission:  Basic Metabolic Panel: Recent Labs  Lab 09/18/23 2213 09/19/23 0633  09/20/23 0443 09/24/23 2142  NA 133* 132* 131* 143  K 3.8 4.1 3.5 4.1  CL 92* 96* 95* 106  CO2 22 23 23 23   GLUCOSE 151* 80 89 113*  BUN 6 6 5* 5*  CREATININE 0.70 0.65 0.58* 0.58*  CALCIUM 10.0 9.0 9.1 9.4  MG 2.3 2.2  --  2.1  PHOS  --  3.3  --   --    Liver Function Tests: Recent Labs  Lab 09/18/23 2213 09/19/23 0633 09/20/23 0443 09/24/23 2142  AST 182* 123* 102* 225*  ALT 120* 90* 76* 176*  ALKPHOS 116 91 99 107  BILITOT 1.0 1.4* 1.0 0.4  PROT 8.1 6.9 7.1 8.0  ALBUMIN 5.2* 4.2 4.2 4.8   Recent Labs  Lab 09/18/23 2213  LIPASE 28   No results for input(s): "AMMONIA" in the last 168 hours. CBC: Recent Labs  Lab 09/18/23 2213 09/19/23 0633 09/20/23 0443 09/24/23 2142  WBC 6.4 4.2 3.9* 5.3  NEUTROABS 5.3  --   --   --   HGB 11.8* 10.5* 11.9* 10.9*  HCT 34.3* 31.0* 36.1* 33.3*  MCV 100.9* 103.0* 104.0* 104.7*  PLT 152 117* 100* 205   Cardiac Enzymes: No results for input(s): "CKTOTAL", "CKMB", "CKMBINDEX", "TROPONINI" in the last 168 hours.  BNP (last 3 results) No results for input(s): "BNP" in the last 8760 hours.  ProBNP (last 3 results) No results for input(s): "PROBNP" in the last 8760 hours.  CBG: Recent Labs  Lab 09/20/23 0405 09/20/23 0752 09/20/23 1155 09/20/23 1954 09/21/23 0733  GLUCAP 95 84 103* 143* 120*    Radiological Exams on Admission: No results found.  EKG: I independently viewed the EKG done and my findings are as followed: EKG was not done in the ED  Assessment/Plan Present on Admission:  Alcohol intoxication (HCC)  Macrocytic anemia  Transaminitis  Acquired hypothyroidism  Principal Problem:   Alcohol intoxication (HCC) Active Problems:   Macrocytic anemia   Acquired hypothyroidism   Transaminitis   Alcohol-induced mood disorder (HCC)  Alcohol intoxication Alcohol induced mood disorder Alcohol level was 387 Patient was started on CIWA protocol, thiamine and multivitamin Continue fall precaution and neuro  checks Patient was counseled on alcohol withdrawal cessation when more stable  Macrocytic anemia MCV 104.7; vitamin B12 and folate levels will be checked  Transaminitis secondary to alcohol consumption AST 225, ALT 176, he denies abdominal pain Continue to monitor liver enzymes  Acquired hypothyroidism Continue Synthroid  DVT prophylaxis: Lovenox  Family Communication: None at bedside  Advance Care Planning:   Code Status: Full Code   Consults: None  Severity of Illness: The appropriate patient status for this patient is INPATIENT. Inpatient status is judged to be reasonable and necessary in order to provide the required intensity of service to ensure the patient's safety. The patient's presenting symptoms, physical exam findings, and initial radiographic and laboratory data in the context of their chronic comorbidities is felt to place them  at high risk for further clinical deterioration. Furthermore, it is not anticipated that the patient will be medically stable for discharge from the hospital within 2 midnights of admission.   * I certify that at the point of admission it is my clinical judgment that the patient will require inpatient hospital care spanning beyond 2 midnights from the point of admission due to high intensity of service, high risk for further deterioration and high frequency of surveillance required.*  Author: Twilla Galea, DO 09/25/2023 6:32 AM  For on call review [email or web address].

## 2023-09-25 NOTE — ED Notes (Signed)
 Pt talking with his mom on phone at this time.

## 2023-09-25 NOTE — Hospital Course (Signed)
 The patient is a 33 year old Caucasian male with a past medical history significant for but limited to alcohol abuse, hypothyroidism, Hodgkin's lymphoma in remission and depression who presented to the ED due to alcohol intoxication.  He is seen in the morning of 09/24/2023 in the ED due to alcohol intoxication and fall but after he arrived to the ED shortly signed out AMA and his mother took him home.  We presented back to the ED around 9 PM after his mother filed IVC paperwork due to patient severe alcohol abuse and statements of suicidal ideation.  Mother states that he has been drinking almost 4 beers daily and his last drink was 7:30 PM prior to admission.  Of note he is also recently admitted from 4/9 to 09/21/2023 for alcohol withdrawal seizure and was placed on CIWA protocol and EEG at that time.  This admission he represented and he is involuntary committed by his mother and initiated on CIWA protocol.  Psychiatry has been engaged but once he is detoxed they are recommending to reach out to TTS for reevaluation for safe discharge disposition or discharge evaluation.    Assessment and Plan:  Alcohol Intoxication Alcohol induced mood disorder: Alcohol level was 387 on admission. Patient was started on CIWA protocol, thiamine and multivitamin. Consider Phenobarbital Taper if withdrawal sx worsen. Continue fall precaution and neuro checks. Patient was counseled on alcohol consumption and cessation. Given 2 L LR bolus on admission.    Macrocytic anemia: Hgb/Hct went from 11.9/36.1 -> 10.9/33.3 -> 9.7/29.2. MCV is now 103.9; Vitamin B12 is 232 and Folate level is 7.6   Abnormal LFTs secondary to alcohol consumption: AST went from 225 -> 156, ALT 176 went from 134, He denies abdominal pain; Continue to monitor LFTs and obt   Acquired Hypothyroidism: C/w Levothyroxine 25 mcg po Daily. Check TSH in the AM   Tobacco Abuse: Smoking Cessation counseling given. C/w Nicotine 21 mg TD   ? Suicidal  Ideation: Psych to be re-consulted once not witdrawing.

## 2023-09-25 NOTE — ED Notes (Signed)
 Pt has drank cup of water and large Gatorade bottle.

## 2023-09-25 NOTE — ED Notes (Signed)
 Pt taken to room for TTS, sitter present

## 2023-09-25 NOTE — ED Notes (Signed)
 Patient ambulated to restroom.

## 2023-09-25 NOTE — TOC CM/SW Note (Signed)
 Transition of Care [facility]) - Inpatient Brief Assessment   Patient Details  Name: [name] MRN: [id number] Date of Birth: Nov 01, 1990  Transition of Care [facility]) CM/SW Contact:    Cyndie Dredge, LCSWA Phone Number: 09/25/2023, 8:16 AM   Clinical Narrative: This patient was here 6 days ago with the same admission dx of Alcohol intoxication.   This Clinical research associate spoke with patient mom . Mom expressed that patient drinks everyday and started having seizures. Mom states that she took out the IVC because of patient ongoing seizures and him not being himself. Mother agreeable to getting patient the help that he needs, if it includes him going somewhere inpatient to help with his intoxication. This Clinical research associate did share with mom that according to patient [facility] note that was completed, "not appropriate for outpatient follow up due to his need for detox".  Mother did confirm that patient does not have a PCP, list was added to AVS.   Transition of Care Department [facility]) has reviewed patient and no TOC needs have been identified at this time. We will continue to monitor patient advancement through interdisciplinary progression rounds. If new patient transition needs arise, please place a TOC consult.  Transition of Care Asessment: Insurance and Status: Insurance coverage has been reviewed Patient has primary care physician: No (PCP list added to AVS) Home environment has been reviewed: Single Family Home with mother Prior level of function:: Independent Prior/Current Home Services: No current home services Social Drivers of Health Review: SDOH reviewed no interventions necessary Readmission risk has been reviewed: Yes Transition of care needs: transition of care needs identified, TOC will continue to follow

## 2023-09-25 NOTE — Progress Notes (Signed)
 LCSW Progress Note:  MRN: [id number]  [name]. [name]  09/25/2023 9:25 PM   Per Lucious Sabina Coleman's note, NP, [facility] provider, the patient has been medically admitted for alcohol detox. The medical team was advised, as noted by the [facility] provider, to reach out to TTS for reevaluation once the patient is medically cleared and stable for disposition or discharge. TTS is to be re-consulted at that time. The patient has been removed from the TTS consult list at this time.

## 2023-09-25 NOTE — Progress Notes (Signed)
 PROGRESS NOTE    [name]  ZOX:[id number] DOB: [birth year] DOA: 09/24/2023 PCP: Pcp, No   Brief Narrative:  The patient is a 33 year old Caucasian male with a past medical history significant for but limited to alcohol abuse, hypothyroidism, Hodgkin's lymphoma in remission and depression who presented to the ED due to alcohol intoxication.  He is seen in the morning of 09/24/2023 in the ED due to alcohol intoxication and fall but after he arrived to the ED shortly signed out AMA and his mother took him home.  We presented back to the ED around 9 PM after his mother filed IVC paperwork due to patient severe alcohol abuse and statements of suicidal ideation.  Mother states that he has been drinking almost 4 beers daily and his last drink was 7:30 PM prior to admission.  Of note he is also recently admitted from 4/9 to 09/21/2023 for alcohol withdrawal seizure and was placed on CIWA protocol and EEG at that time.  This admission he represented and he is involuntary committed by his mother and initiated on CIWA protocol.  Psychiatry has been engaged but once he is detoxed they are recommending to reach out to TTS for reevaluation for safe discharge disposition or discharge evaluation.    Assessment and Plan:  Alcohol Intoxication Alcohol induced mood disorder: Alcohol level was 387 on admission. Patient was started on CIWA protocol, thiamine and multivitamin. Consider Phenobarbital Taper if withdrawal sx worsen. Continue fall precaution and neuro checks. Patient was counseled on alcohol consumption and cessation. Given 2 L LR bolus on admission.    Macrocytic anemia: Hgb/Hct went from 11.9/36.1 -> 10.9/33.3 -> 9.7/29.2. MCV is now 103.9; Vitamin B12 is 232 and Folate level is 7.6   Abnormal LFTs secondary to alcohol consumption: AST went from 225 -> 156, ALT 176 went from 134, He denies abdominal pain; Continue to monitor LFTs and obt   Acquired Hypothyroidism: C/w Levothyroxine 25 mcg po  Daily. Check TSH in the AM   Tobacco Abuse: Smoking Cessation counseling given. C/w Nicotine 21 mg TD   ? Suicidal Ideation: Psych to be re-consulted once not witdrawing.    DVT prophylaxis: enoxaparin (LOVENOX) injection 40 mg Start: 09/25/23 1000 SCDs Start: 09/25/23 8119    Code Status: Full Code Family Communication: No family present at bedside  Disposition Plan:  Level of care: Med-Surg Status is: Inpatient Remains inpatient appropriate because: Needs further clinical improvement    Consultants:  Psychiatry   Procedures:  As delineated as above  Antimicrobials:  Anti-infectives (From admission, onward)    None       Objective: Vitals:   09/25/23 0957 09/25/23 1040 09/25/23 1151 09/25/23 1420  BP: (!) 153/96 133/84 133/84 130/80  Pulse: 90 66 66 70  Resp:      Temp:   98.8 F (37.1 C)   TempSrc:   Oral   SpO2:   100%   Weight:      Height:        Intake/Output Summary (Last 24 hours) at 09/25/2023 1603 Last data filed at 09/25/2023 1215 Gross per 24 hour  Intake 2285.94 ml  Output --  Net 2285.94 ml   Filed Weights   09/24/23 2103  Weight: 56.6 kg  Data Reviewed: I have personally reviewed following labs and imaging studies  CBC: Recent Labs  Lab 09/18/23 2213 09/19/23 0633 09/20/23 0443 09/24/23 2142 09/25/23 0638  WBC 6.4 4.2 3.9* 5.3 4.1  NEUTROABS 5.3  --   --   --   --  HGB 11.8* 10.5* 11.9* 10.9* 9.7*  HCT 34.3* 31.0* 36.1* 33.3* 29.2*  MCV 100.9* 103.0* 104.0* 104.7* 103.9*  PLT 152 117* 100* 205 164   Basic Metabolic Panel: Recent Labs  Lab 09/18/23 2213 09/19/23 0633 09/20/23 0443 09/24/23 2142 09/25/23 0638  NA 133* 132* 131* 143 137  K 3.8 4.1 3.5 4.1 3.7  CL 92* 96* 95* 106 104  CO2 22 23 23 23 23   GLUCOSE 151* 80 89 113* 88  BUN 6 6 5* 5* 5*  CREATININE 0.70 0.65 0.58* 0.58* 0.42*  CALCIUM 10.0 9.0 9.1 9.4 8.9  MG 2.3 2.2  --  2.1 1.8  PHOS  --  3.3  --   --   --    GFR: Estimated Creatinine Clearance: 104.2  mL/min (A) (by C-G formula based on SCr of 0.42 mg/dL (L)). Liver Function Tests: Recent Labs  Lab 09/18/23 2213 09/19/23 0633 09/20/23 0443 09/24/23 2142 09/25/23 0638  AST 182* 123* 102* 225* 156*  ALT 120* 90* 76* 176* 134*  ALKPHOS 116 91 99 107 85  BILITOT 1.0 1.4* 1.0 0.4 0.4  PROT 8.1 6.9 7.1 8.0 6.7  ALBUMIN 5.2* 4.2 4.2 4.8 3.9   Recent Labs  Lab 09/18/23 2213  LIPASE 28   No results for input(s): "AMMONIA" in the last 168 hours. Coagulation Profile: No results for input(s): "INR", "PROTIME" in the last 168 hours. Cardiac Enzymes: No results for input(s): "CKTOTAL", "CKMB", "CKMBINDEX", "TROPONINI" in the last 168 hours. BNP (last 3 results) No results for input(s): "PROBNP" in the last 8760 hours. HbA1C: No results for input(s): "HGBA1C" in the last 72 hours. CBG: Recent Labs  Lab 09/20/23 0405 09/20/23 0752 09/20/23 1155 09/20/23 1954 09/21/23 0733  GLUCAP 95 84 103* 143* 120*   Lipid Profile: No results for input(s): "CHOL", "HDL", "LDLCALC", "TRIG", "CHOLHDL", "LDLDIRECT" in the last 72 hours. Thyroid Function Tests: No results for input(s): "TSH", "T4TOTAL", "FREET4", "T3FREE", "THYROIDAB" in the last 72 hours. Anemia Panel: Recent Labs    09/25/23 0638  VITAMINB12 232  FOLATE 7.6   Sepsis Labs: No results for input(s): "PROCALCITON", "LATICACIDVEN" in the last 168 hours.  Recent Results (from the past 240 hours)  Resp panel by RT-PCR (RSV, Flu A&B, Covid) Anterior Nasal Swab     Status: None   Collection Time: 09/18/23 10:37 PM   Specimen: Anterior Nasal Swab  Result Value Ref Range Status   SARS Coronavirus 2 by RT PCR NEGATIVE NEGATIVE Final    Comment: (NOTE) SARS-CoV-2 target nucleic acids are NOT DETECTED.  The SARS-CoV-2 RNA is generally detectable in upper respiratory specimens during the acute phase of infection. The lowest concentration of SARS-CoV-2 viral copies this assay can detect is 138 copies/mL. A negative result does  not preclude SARS-Cov-2 infection and should not be used as the sole basis for treatment or other patient management decisions. A negative result may occur with  improper specimen collection/handling, submission of specimen other than nasopharyngeal swab, presence of viral mutation(s) within the areas targeted by this assay, and inadequate number of viral copies(<138 copies/mL). A negative result must be combined with clinical observations, patient history, and epidemiological information. The expected result is Negative.  Fact Sheet for Patients:  BloggerCourse.com  Fact Sheet for Healthcare Providers:  SeriousBroker.it  This test is no t yet approved or cleared by the [place] FDA and  has been authorized for detection and/or diagnosis of SARS-CoV-2 by FDA under an Emergency Use Authorization (EUA). This EUA will remain  in effect (meaning this test can be used) for the duration of the COVID-19 declaration under Section 564(b)(1) of the Act, 21 U.S.C.section 360bbb-3(b)(1), unless the authorization is terminated  or revoked sooner.       Influenza A by PCR NEGATIVE NEGATIVE Final   Influenza B by PCR NEGATIVE NEGATIVE Final    Comment: (NOTE) The Xpert Xpress SARS-CoV-2/FLU/RSV plus assay is intended as an aid in the diagnosis of influenza from Nasopharyngeal swab specimens and should not be used as a sole basis for treatment. Nasal washings and aspirates are unacceptable for Xpert Xpress SARS-CoV-2/FLU/RSV testing.  Fact Sheet for Patients: BloggerCourse.com  Fact Sheet for Healthcare Providers: SeriousBroker.it  This test is not yet approved or cleared by the [place] FDA and has been authorized for detection and/or diagnosis of SARS-CoV-2 by FDA under an Emergency Use Authorization (EUA). This EUA will remain in effect (meaning this test can be used) for the  duration of the COVID-19 declaration under Section 564(b)(1) of the Act, 21 U.S.C. section 360bbb-3(b)(1), unless the authorization is terminated or revoked.     Resp Syncytial Virus by PCR NEGATIVE NEGATIVE Final    Comment: (NOTE) Fact Sheet for Patients: BloggerCourse.com  Fact Sheet for Healthcare Providers: SeriousBroker.it  This test is not yet approved or cleared by the [place] FDA and has been authorized for detection and/or diagnosis of SARS-CoV-2 by FDA under an Emergency Use Authorization (EUA). This EUA will remain in effect (meaning this test can be used) for the duration of the COVID-19 declaration under Section 564(b)(1) of the Act, 21 U.S.C. section 360bbb-3(b)(1), unless the authorization is terminated or revoked.  Performed at [facility], [address], [place], [place] [address]   MRSA Next Gen by PCR, Nasal     Status: Abnormal   Collection Time: 09/19/23  7:40 AM   Specimen: Nasal Mucosa; Nasal Swab  Result Value Ref Range Status   MRSA by PCR Next Gen DETECTED (A) NOT DETECTED Final    Comment: RESULT CALLED TO, READ BACK BY AND VERIFIED WITH: G ROOS AT 1550 09/19/23 BY A WILSON (NOTE) The GeneXpert MRSA Assay (FDA approved for NASAL specimens only), is one component of a comprehensive MRSA colonization surveillance program. It is not intended to diagnose MRSA infection nor to guide or monitor treatment for MRSA infections. Test performance is not FDA approved in patients less than 10 years old. Performed at [facility], [address], [place], [place] [address]     Radiology Studies: No results found.  Scheduled Meds:  enoxaparin (LOVENOX) injection  40 mg Subcutaneous Q24H   [START ON 09/26/2023] levothyroxine  25 mcg Oral Q0600   LORazepam  0-4 mg Intravenous Q6H   Or   LORazepam  0-4 mg Oral Q6H   [START ON 09/27/2023] LORazepam  0-4 mg Intravenous Q12H   Or   [START ON 09/27/2023]  LORazepam  0-4 mg Oral Q12H   nicotine  21 mg Transdermal Once   thiamine  100 mg Oral Daily   Or   thiamine  100 mg Intravenous Daily   Continuous Infusions:   LOS: 0 days   Marguerita Merles, DO [facility] Available via Epic secure chat 7am-7pm After these hours, please refer to coverage provider listed on [email or web address] 09/25/2023, 4:03 PM

## 2023-09-26 DIAGNOSIS — F10920 Alcohol use, unspecified with intoxication, uncomplicated: Secondary | ICD-10-CM | POA: Diagnosis not present

## 2023-09-26 LAB — COMPREHENSIVE METABOLIC PANEL WITH GFR
ALT: 108 U/L — ABNORMAL HIGH (ref 0–44)
AST: 93 U/L — ABNORMAL HIGH (ref 15–41)
Albumin: 4.1 g/dL (ref 3.5–5.0)
Alkaline Phosphatase: 99 U/L (ref 38–126)
Anion gap: 11 (ref 5–15)
BUN: 6 mg/dL (ref 6–20)
CO2: 27 mmol/L (ref 22–32)
Calcium: 9.6 mg/dL (ref 8.9–10.3)
Chloride: 97 mmol/L — ABNORMAL LOW (ref 98–111)
Creatinine, Ser: 0.48 mg/dL — ABNORMAL LOW (ref 0.61–1.24)
GFR, Estimated: >60 mL/min (ref >60)
Glucose, Bld: 92 mg/dL (ref 70–99)
Potassium: 3.9 mmol/L (ref 3.5–5.1)
Sodium: 135 mmol/L (ref 135–145)
Total Bilirubin: 1 mg/dL (ref 0.0–1.2)
Total Protein: 7.1 g/dL (ref 6.5–8.1)

## 2023-09-26 LAB — CBC WITH DIFFERENTIAL/PLATELET
Abs Immature Granulocytes: 0.12 10*3/uL — ABNORMAL HIGH (ref 0.00–0.07)
Basophils Absolute: 0.1 10*3/uL (ref 0.0–0.1)
Basophils Relative: 2 %
Eosinophils Absolute: 0.1 10*3/uL (ref 0.0–0.5)
Eosinophils Relative: 2 %
HCT: 33.5 % — ABNORMAL LOW (ref 39.0–52.0)
Hemoglobin: 11.5 g/dL — ABNORMAL LOW (ref 13.0–17.0)
Immature Granulocytes: 4 %
Lymphocytes Relative: 29 %
Lymphs Abs: 1 10*3/uL (ref 0.7–4.0)
MCH: 34.7 pg — ABNORMAL HIGH (ref 26.0–34.0)
MCHC: 34.3 g/dL (ref 30.0–36.0)
MCV: 101.2 fL — ABNORMAL HIGH (ref 80.0–100.0)
Monocytes Absolute: 0.7 10*3/uL (ref 0.1–1.0)
Monocytes Relative: 21 %
Neutro Abs: 1.5 10*3/uL — ABNORMAL LOW (ref 1.7–7.7)
Neutrophils Relative %: 42 %
Platelets: 199 10*3/uL (ref 150–400)
RBC: 3.31 MIL/uL — ABNORMAL LOW (ref 4.22–5.81)
RDW: 13.2 % (ref 11.5–15.5)
WBC: 3.4 10*3/uL — ABNORMAL LOW (ref 4.0–10.5)
nRBC: 0 % (ref 0.0–0.2)

## 2023-09-26 LAB — TSH: TSH: 28.709 u[IU]/mL — ABNORMAL HIGH (ref 0.350–4.500)

## 2023-09-26 LAB — PHOSPHORUS: Phosphorus: 4.7 mg/dL — ABNORMAL HIGH (ref 2.5–4.6)

## 2023-09-26 LAB — MAGNESIUM: Magnesium: 2.2 mg/dL (ref 1.7–2.4)

## 2023-09-26 MED ORDER — LEVOTHYROXINE SODIUM 50 MCG PO TABS
50.0000 ug | ORAL_TABLET | Freq: Every day | ORAL | Status: DC
  Administered 2023-09-27: 50 ug via ORAL
  Filled 2023-09-26: qty 1

## 2023-09-26 NOTE — Plan of Care (Signed)

## 2023-09-26 NOTE — Progress Notes (Signed)
 Has been pleasant and appreciative of care.  Scored a 5 earlier for tremors but denies any other symptoms of withdrawal. Denies any plans or thoughts of suicide at this point.  Given 1 mg of ativan earlier with scheduled lithium and has been as asleep this evening. Sitter at bedside.

## 2023-09-26 NOTE — Progress Notes (Signed)
 PROGRESS NOTE    [name]  ZOX:[id number] DOB: November 09, 1990 DOA: 09/24/2023 PCP: Pcp, No   Brief Narrative:    The patient is a 33 year old Caucasian male with a past medical history significant for but limited to alcohol abuse, hypothyroidism, Hodgkin's lymphoma in remission and depression who presented to the ED due to alcohol intoxication.  He is seen in the morning of 09/24/2023 in the ED due to alcohol intoxication and fall but after he arrived to the ED shortly signed out AMA and his mother took him home.  We presented back to the ED around 9 PM after his mother filed IVC paperwork due to patient severe alcohol abuse and statements of suicidal ideation.  Mother states that he has been drinking almost 4 beers daily and his last drink was 7:30 PM prior to admission.  Of note he is also recently admitted from 4/9 to 09/21/2023 for alcohol withdrawal seizure and was placed on CIWA protocol and EEG at that time.   This admission he represented and he is involuntary committed by his mother and initiated on CIWA protocol.  Psychiatry has been engaged but once he is detoxed they are recommending to reach out to TTS for reevaluation for safe discharge disposition or discharge evaluation.  Assessment & Plan:   Principal Problem:   Alcohol intoxication (HCC) Active Problems:   Macrocytic anemia   Acquired hypothyroidism   Transaminitis   Alcohol-induced mood disorder (HCC)  Assessment and Plan:   Alcohol Intoxication-resolved Alcohol induced mood disorder: Alcohol level was 387 on admission. Patient was started on CIWA protocol, thiamine and multivitamin. Consider Phenobarbital Taper if withdrawal sx worsen. Continue fall precaution and neuro checks. Patient was counseled on alcohol consumption and cessation. Given 2 L LR bolus on admission.    Macrocytic anemia: Hgb/Hct went from 11.9/36.1 -> 10.9/33.3 -> 9.7/29.2. MCV is now 103.9; Vitamin B12 is 232 and Folate level is 7.6    Abnormal LFTs secondary to alcohol consumption: AST went from 225 -> 156, ALT 176 went from 134, He denies abdominal pain; Continue to monitor LFTs noted downward trend   Acquired Hypothyroidism: C/w levothyroxine 50 mcg p.o. daily based on labs.   Tobacco Abuse: Smoking Cessation counseling given. C/w Nicotine 21 mg TD    ? Suicidal Ideation: Appreciate psychiatry evaluation.  Patient is medically cleared for evaluation.    DVT prophylaxis:Lovenox Code Status: Full Family Communication: None at bedside Disposition Plan:  Status is: Inpatient Remains inpatient appropriate because: Need for close monitoring due to behavioral disturbances and need for IV medication.  Consultants:  TTS  Procedures:  None  Antimicrobials:     Subjective: Patient seen and evaluated today with no new acute complaints or concerns. No acute concerns or events noted overnight.  Objective: Vitals:   09/25/23 2125 09/26/23 0500 09/26/23 0905 09/26/23 1258  BP: (!) 150/108 (!) 152/99 (!) 148/98 (!) 131/101  Pulse: 84 90 (!) 101 95  Resp: 16 18  17   Temp: 98.3 F (36.8 C) 98 F (36.7 C)  98.1 F (36.7 C)  TempSrc: Oral Oral    SpO2: 99% 98% 99% 99%  Weight:      Height:        Intake/Output Summary (Last 24 hours) at 09/26/2023 1521 Last data filed at 09/26/2023 1441 Gross per 24 hour  Intake 720 ml  Output --  Net 720 ml   Filed Weights   09/24/23 2103  Weight: 56.6 kg    Examination:  General exam: Appears calm  and comfortable  Respiratory system: Clear to auscultation. Respiratory effort normal. Cardiovascular system: S1 & S2 heard, RRR.  Gastrointestinal system: Abdomen is soft Central nervous system: Alert and awake Extremities: No edema Skin: No significant lesions noted Psychiatry: Flat affect.    Data Reviewed: I have personally reviewed following labs and imaging studies  CBC: Recent Labs  Lab 09/20/23 0443 09/24/23 2142 09/25/23 0638 09/26/23 0409  WBC 3.9*  5.3 4.1 3.4*  NEUTROABS  --   --   --  1.5*  HGB 11.9* 10.9* 9.7* 11.5*  HCT 36.1* 33.3* 29.2* 33.5*  MCV 104.0* 104.7* 103.9* 101.2*  PLT 100* 205 164 199   Basic Metabolic Panel: Recent Labs  Lab 09/20/23 0443 09/24/23 2142 09/25/23 0638 09/26/23 0409  NA 131* 143 137 135  K 3.5 4.1 3.7 3.9  CL 95* 106 104 97*  CO2 23 23 23 27   GLUCOSE 89 113* 88 92  BUN 5* 5* 5* 6  CREATININE 0.58* 0.58* 0.42* 0.48*  CALCIUM 9.1 9.4 8.9 9.6  MG  --  2.1 1.8 2.2  PHOS  --   --   --  4.7*   GFR: Estimated Creatinine Clearance: 104.2 mL/min (A) (by C-G formula based on SCr of 0.48 mg/dL (L)). Liver Function Tests: Recent Labs  Lab 09/20/23 0443 09/24/23 2142 09/25/23 0638 09/26/23 0409  AST 102* 225* 156* 93*  ALT 76* 176* 134* 108*  ALKPHOS 99 107 85 99  BILITOT 1.0 0.4 0.4 1.0  PROT 7.1 8.0 6.7 7.1  ALBUMIN 4.2 4.8 3.9 4.1   No results for input(s): "LIPASE", "AMYLASE" in the last 168 hours. No results for input(s): "AMMONIA" in the last 168 hours. Coagulation Profile: No results for input(s): "INR", "PROTIME" in the last 168 hours. Cardiac Enzymes: No results for input(s): "CKTOTAL", "CKMB", "CKMBINDEX", "TROPONINI" in the last 168 hours. BNP (last 3 results) No results for input(s): "PROBNP" in the last 8760 hours. HbA1C: No results for input(s): "HGBA1C" in the last 72 hours. CBG: Recent Labs  Lab 09/20/23 0405 09/20/23 0752 09/20/23 1155 09/20/23 1954 09/21/23 0733  GLUCAP 95 84 103* 143* 120*   Lipid Profile: No results for input(s): "CHOL", "HDL", "LDLCALC", "TRIG", "CHOLHDL", "LDLDIRECT" in the last 72 hours. Thyroid Function Tests: Recent Labs    09/26/23 0409  TSH 28.709*   Anemia Panel: Recent Labs    09/25/23 0638  VITAMINB12 232  FOLATE 7.6   Sepsis Labs: No results for input(s): "PROCALCITON", "LATICACIDVEN" in the last 168 hours.  Recent Results (from the past 240 hours)  Resp panel by RT-PCR (RSV, Flu A&B, Covid) Anterior Nasal Swab      Status: None   Collection Time: 09/18/23 10:37 PM   Specimen: Anterior Nasal Swab  Result Value Ref Range Status   SARS Coronavirus 2 by RT PCR NEGATIVE NEGATIVE Final    Comment: (NOTE) SARS-CoV-2 target nucleic acids are NOT DETECTED.  The SARS-CoV-2 RNA is generally detectable in upper respiratory specimens during the acute phase of infection. The lowest concentration of SARS-CoV-2 viral copies this assay can detect is 138 copies/mL. A negative result does not preclude SARS-Cov-2 infection and should not be used as the sole basis for treatment or other patient management decisions. A negative result may occur with  improper specimen collection/handling, submission of specimen other than nasopharyngeal swab, presence of viral mutation(s) within the areas targeted by this assay, and inadequate number of viral copies(<138 copies/mL). A negative result must be combined with clinical observations, patient history, and  epidemiological information. The expected result is Negative.  Fact Sheet for Patients:  BloggerCourse.com  Fact Sheet for Healthcare Providers:  SeriousBroker.it  This test is no t yet approved or cleared by the [place] FDA and  has been authorized for detection and/or diagnosis of SARS-CoV-2 by FDA under an Emergency Use Authorization (EUA). This EUA will remain  in effect (meaning this test can be used) for the duration of the COVID-19 declaration under Section 564(b)(1) of the Act, 21 U.S.C.section 360bbb-3(b)(1), unless the authorization is terminated  or revoked sooner.       Influenza A by PCR NEGATIVE NEGATIVE Final   Influenza B by PCR NEGATIVE NEGATIVE Final    Comment: (NOTE) The Xpert Xpress SARS-CoV-2/FLU/RSV plus assay is intended as an aid in the diagnosis of influenza from Nasopharyngeal swab specimens and should not be used as a sole basis for treatment. Nasal washings and aspirates are  unacceptable for Xpert Xpress SARS-CoV-2/FLU/RSV testing.  Fact Sheet for Patients: BloggerCourse.com  Fact Sheet for Healthcare Providers: SeriousBroker.it  This test is not yet approved or cleared by the [place] FDA and has been authorized for detection and/or diagnosis of SARS-CoV-2 by FDA under an Emergency Use Authorization (EUA). This EUA will remain in effect (meaning this test can be used) for the duration of the COVID-19 declaration under Section 564(b)(1) of the Act, 21 U.S.C. section 360bbb-3(b)(1), unless the authorization is terminated or revoked.     Resp Syncytial Virus by PCR NEGATIVE NEGATIVE Final    Comment: (NOTE) Fact Sheet for Patients: BloggerCourse.com  Fact Sheet for Healthcare Providers: SeriousBroker.it  This test is not yet approved or cleared by the [place] FDA and has been authorized for detection and/or diagnosis of SARS-CoV-2 by FDA under an Emergency Use Authorization (EUA). This EUA will remain in effect (meaning this test can be used) for the duration of the COVID-19 declaration under Section 564(b)(1) of the Act, 21 U.S.C. section 360bbb-3(b)(1), unless the authorization is terminated or revoked.  Performed at [facility], [address] [place], [place] [address]   MRSA Next Gen by PCR, Nasal     Status: Abnormal   Collection Time: 09/19/23  7:40 AM   Specimen: Nasal Mucosa; Nasal Swab  Result Value Ref Range Status   MRSA by PCR Next Gen DETECTED (A) NOT DETECTED Final    Comment: RESULT CALLED TO, READ BACK BY AND VERIFIED WITH: G ROOS AT 1550 09/19/23 BY A WILSON (NOTE) The GeneXpert MRSA Assay (FDA approved for NASAL specimens only), is one component of a comprehensive MRSA colonization surveillance program. It is not intended to diagnose MRSA infection nor to guide or monitor treatment for MRSA infections. Test  performance is not FDA approved in patients less than 57 years old. Performed at [facility], [address], [place], [place] [address]          Radiology Studies: No results found.      Scheduled Meds:  chlordiazePOXIDE  25 mg Oral QID   Followed by   Melene Muller ON 09/27/2023] chlordiazePOXIDE  25 mg Oral TID   Followed by   Melene Muller ON 09/28/2023] chlordiazePOXIDE  25 mg Oral BH-qamhs   Followed by   Melene Muller ON 09/29/2023] chlordiazePOXIDE  25 mg Oral Daily   enoxaparin (LOVENOX) injection  40 mg Subcutaneous Q24H   [START ON 09/27/2023] levothyroxine  50 mcg Oral Q0600   LORazepam  0-4 mg Intravenous Q12H   Or   LORazepam  0-4 mg Oral Q12H  multivitamin with minerals  1 tablet Oral Daily   thiamine  100 mg Oral Daily     LOS: 1 day    Time spent: 35 minutes    Sheddrick Lattanzio Loran Rock, DO [facility]  If 7PM-7AM, please contact night-coverage [email or web address] 09/26/2023, 3:21 PM

## 2023-09-27 DIAGNOSIS — F10929 Alcohol use, unspecified with intoxication, unspecified: Secondary | ICD-10-CM

## 2023-09-27 LAB — COMPREHENSIVE METABOLIC PANEL WITH GFR
ALT: 96 U/L — ABNORMAL HIGH (ref 0–44)
AST: 90 U/L — ABNORMAL HIGH (ref 15–41)
Albumin: 4 g/dL (ref 3.5–5.0)
Alkaline Phosphatase: 93 U/L (ref 38–126)
Anion gap: 10 (ref 5–15)
BUN: 5 mg/dL — ABNORMAL LOW (ref 6–20)
CO2: 27 mmol/L (ref 22–32)
Calcium: 9.4 mg/dL (ref 8.9–10.3)
Chloride: 97 mmol/L — ABNORMAL LOW (ref 98–111)
Creatinine, Ser: 0.54 mg/dL — ABNORMAL LOW (ref 0.61–1.24)
GFR, Estimated: >60 mL/min (ref >60)
Glucose, Bld: 103 mg/dL — ABNORMAL HIGH (ref 70–99)
Potassium: 3.6 mmol/L (ref 3.5–5.1)
Sodium: 134 mmol/L — ABNORMAL LOW (ref 135–145)
Total Bilirubin: 0.4 mg/dL (ref 0.0–1.2)
Total Protein: 7.1 g/dL (ref 6.5–8.1)

## 2023-09-27 LAB — CBC
HCT: 32.8 % — ABNORMAL LOW (ref 39.0–52.0)
Hemoglobin: 10.7 g/dL — ABNORMAL LOW (ref 13.0–17.0)
MCH: 34.1 pg — ABNORMAL HIGH (ref 26.0–34.0)
MCHC: 32.6 g/dL (ref 30.0–36.0)
MCV: 104.5 fL — ABNORMAL HIGH (ref 80.0–100.0)
Platelets: 187 10*3/uL (ref 150–400)
RBC: 3.14 MIL/uL — ABNORMAL LOW (ref 4.22–5.81)
RDW: 13.3 % (ref 11.5–15.5)
WBC: 3.3 10*3/uL — ABNORMAL LOW (ref 4.0–10.5)
nRBC: 0 % (ref 0.0–0.2)

## 2023-09-27 LAB — MAGNESIUM: Magnesium: 2 mg/dL (ref 1.7–2.4)

## 2023-09-27 MED ORDER — LEVOTHYROXINE SODIUM 50 MCG PO TABS: 50.0000 ug | ORAL_TABLET | Freq: Every day | ORAL | 1 refills | Status: AC

## 2023-09-27 MED ORDER — VITAMIN B-1 100 MG PO TABS
100.0000 mg | ORAL_TABLET | Freq: Every day | ORAL | 0 refills | Status: AC
Start: 2023-09-28 — End: ?

## 2023-09-27 NOTE — Discharge Summary (Signed)
 Physician Discharge Summary  [name]:[id number] DOB: 10-Oct-1990 DOA: 09/24/2023  PCP: Pcp, No  Admit date: 09/24/2023  Discharge date: 09/27/2023  Admitted From:Home  Disposition:  Home  Recommendations for Outpatient Follow-up:  Follow up with PCP in 1-2 weeks Advised on alcohol  cessation Adjusted levothyroxine  50 mcg daily, follow-up with PCP to recheck TSH Cleared by TTS with no need for further behavioral health evaluation  Home Health: None  Equipment/Devices: None  Discharge Condition:Stable  CODE STATUS: Full  Diet recommendation: Heart Healthy  Brief/Interim Summary: The patient is a 33 year old Caucasian male with a past medical history significant for but limited to alcohol  abuse, hypothyroidism, Hodgkin's lymphoma in remission and depression who presented to the ED due to alcohol  intoxication.  He is seen in the morning of 09/24/2023 in the ED due to alcohol  intoxication and fall but after he arrived to the ED shortly signed out AMA and his mother took him home.  We presented back to the ED around 9 PM after his mother filed IVC paperwork due to patient severe alcohol  abuse and statements of suicidal ideation.  Mother states that he has been drinking almost 4 beers daily and his last drink was 7:30 PM prior to admission.  Of note he is also recently admitted from 4/9 to 09/21/2023 for alcohol  withdrawal seizure and was placed on CIWA protocol and EEG at that time.   This admission he represented and he is involuntary committed by his mother and initiated on CIWA protocol.  Psychiatry has been engaged and evaluation on the day of discharge reveals that patient is cleared and requires no other behavioral health interventions.  He is now in stable condition for discharge and has been counseled on alcohol  cessation.  Discharge Diagnoses:  Principal Problem:   Alcohol  intoxication (HCC) Active Problems:   Macrocytic anemia   Acquired hypothyroidism    Transaminitis   Alcohol -induced mood disorder (HCC)  Principal discharge diagnosis: Acute alcohol  intoxication with abnormal LFTs and suicidal ideation.  Discharge Instructions  Discharge Instructions     Diet - low sodium heart healthy   Complete by: As directed    Increase activity slowly   Complete by: As directed       Allergies as of 09/27/2023       Reactions   Morphine And Codeine Itching        Medication List     TAKE these medications    chlordiazePOXIDE  5 MG capsule Commonly known as: LIBRIUM  Take 1 capsule (5 mg total) by mouth 3 (three) times daily as needed for withdrawal or anxiety.   levothyroxine  50 MCG tablet Commonly known as: SYNTHROID  Take 1 tablet (50 mcg total) by mouth daily at 6 (six) AM. Start taking on: September 28, 2023 What changed:  medication strength how much to take   pantoprazole  40 MG tablet Commonly known as: PROTONIX  Take 1 tablet (40 mg total) by mouth daily.   thiamine  100 MG tablet Commonly known as: Vitamin B-1 Take 1 tablet (100 mg total) by mouth daily. Start taking on: September 28, 2023        Allergies  Allergen Reactions   Morphine And Codeine Itching    Consultations: TTS   Procedures/Studies: EEG adult Result Date: 09/20/2023 Justin Lack, MD     09/20/2023  4:24 AM Patient Name: [name] MRN: [id number] Epilepsy Attending: Arleene Evans Referring Physician/Provider: Edson Graces, MD Date: 09/19/2023 Duration: 22.25 mins Patient history: 33yo M with alcohol  withdrawal seizure. EEG  to evaluate for seizure Level of alertness: Awake AEDs during EEG study: Ativan  Technical aspects: This EEG study was done with scalp electrodes positioned according to the 10-20 International system of electrode placement. Electrical activity was reviewed with band pass filter of 1-70Hz , sensitivity of 7 uV/mm, display speed of 42mm/sec with a 60Hz  notched filter applied as appropriate. EEG data were recorded  continuously and digitally stored.  Video monitoring was available and reviewed as appropriate. Description: The posterior dominant rhythm consists of 8 Hz activity of moderate voltage (25-35 uV) seen predominantly in posterior head regions, symmetric and reactive to eye opening and eye closing. Physiologic photic driving was seen during photic stimulation.  Hyperventilation was not performed.   EEG was technically difficult due to significant electrode artifact. IMPRESSION: This technically difficult study is within normal limits. No seizures or epileptiform discharges were seen throughout the recording. A normal interictal EEG does not exclude the diagnosis of epilepsy. [name]   CT HEAD WO CONTRAST ( ) Result Date: 09/19/2023 CLINICAL DATA:  Initial evaluation for acute head trauma. EXAM: CT HEAD WITHOUT CONTRAST TECHNIQUE: Contiguous axial images were obtained from the base of the skull through the vertex without intravenous contrast. RADIATION DOSE REDUCTION: This exam was performed according to the departmental dose-optimization program which includes automated exposure control, adjustment of the mA and/or kV according to patient size and/or use of iterative reconstruction technique. COMPARISON:  CT from 04/03/2012. FINDINGS: Brain: Cerebral volume within normal limits for age no acute intracranial hemorrhage. No acute large vessel territory infarct. No mass lesion or midline shift. No hydrocephalus or extra-axial fluid collection. Vascular: No abnormal hyperdense vessel. Skull: Scalp soft tissues demonstrate no acute finding. Calvarium intact. Sinuses/Orbits: Globes orbital soft tissues within normal limits. Visualized paranasal sinuses are clear. No mastoid effusion. Other: None. IMPRESSION: Normal head CT. No acute intracranial abnormality. Electronically Signed   By: [name] M.D.   On: 09/19/2023 03:25     Discharge Exam: Vitals:   09/27/23 0347 09/27/23 1424  BP: (!)  141/94 (!) 121/95  Pulse: 88 (!) 105  Resp: 16 17  Temp: 98.4 F (36.9 C) 98.2 F (36.8 C)  SpO2: 98%    Vitals:   09/26/23 1258 09/26/23 1944 09/27/23 0347 09/27/23 1424  BP: (!) 131/101 (!) 125/92 (!) 141/94 (!) 121/95  Pulse: 95 90 88 (!) 105  Resp: 17 20 16 17   Temp: 98.1 F (36.7 C) 99.6 F (37.6 C) 98.4 F (36.9 C) 98.2 F (36.8 C)  TempSrc:  Oral Oral   SpO2: 99% 100% 98%   Weight:      Height:        General: Pt is alert, awake, not in acute distress Cardiovascular: RRR, S1/S2 +, no rubs, no gallops Respiratory: CTA bilaterally, no wheezing, no rhonchi Abdominal: Soft, NT, ND, bowel sounds + Extremities: no edema, no cyanosis    The results of significant diagnostics from this hospitalization (including imaging, microbiology, ancillary and laboratory) are listed below for reference.     Microbiology: Recent Results (from the past 240 hours)  Resp panel by RT-PCR (RSV, Flu A&B, Covid) Anterior Nasal Swab     Status: None   Collection Time: 09/18/23 10:37 PM   Specimen: Anterior Nasal Swab  Result Value Ref Range Status   SARS Coronavirus 2 by RT PCR NEGATIVE NEGATIVE Final    Comment: (NOTE) SARS-CoV-2 target nucleic acids are NOT DETECTED.  The SARS-CoV-2 RNA is generally detectable in upper respiratory specimens during the acute phase  of infection. The lowest concentration of SARS-CoV-2 viral copies this assay can detect is 138 copies/mL. A negative result does not preclude SARS-Cov-2 infection and should not be used as the sole basis for treatment or other patient management decisions. A negative result may occur with  improper specimen collection/handling, submission of specimen other than nasopharyngeal swab, presence of viral mutation(s) within the areas targeted by this assay, and inadequate number of viral copies(<138 copies/mL). A negative result must be combined with clinical observations, patient history, and epidemiological information. The  expected result is Negative.  Fact Sheet for Patients:  BloggerCourse.com  Fact Sheet for Healthcare Providers:  SeriousBroker.it  This test is no t yet approved or cleared by the [place]  FDA and  has been authorized for detection and/or diagnosis of SARS-CoV-2 by FDA under an Emergency Use Authorization (EUA). This EUA will remain  in effect (meaning this test can be used) for the duration of the COVID-19 declaration under Section 564(b)(1) of the Act, 21 U.S.C.section 360bbb-3(b)(1), unless the authorization is terminated  or revoked sooner.       Influenza A by PCR NEGATIVE NEGATIVE Final   Influenza B by PCR NEGATIVE NEGATIVE Final    Comment: (NOTE) The Xpert Xpress SARS-CoV-2/FLU/RSV plus assay is intended as an aid in the diagnosis of influenza from Nasopharyngeal swab specimens and should not be used as a sole basis for treatment. Nasal washings and aspirates are unacceptable for Xpert Xpress SARS-CoV-2/FLU/RSV testing.  Fact Sheet for Patients: BloggerCourse.com  Fact Sheet for Healthcare Providers: SeriousBroker.it  This test is not yet approved or cleared by the [place]  FDA and has been authorized for detection and/or diagnosis of SARS-CoV-2 by FDA under an Emergency Use Authorization (EUA). This EUA will remain in effect (meaning this test can be used) for the duration of the COVID-19 declaration under Section 564(b)(1) of the Act, 21 U.S.C. section 360bbb-3(b)(1), unless the authorization is terminated or revoked.     Resp Syncytial Virus by PCR NEGATIVE NEGATIVE Final    Comment: (NOTE) Fact Sheet for Patients: BloggerCourse.com  Fact Sheet for Healthcare Providers: SeriousBroker.it  This test is not yet approved or cleared by the [place]  FDA and has been authorized for detection and/or  diagnosis of SARS-CoV-2 by FDA under an Emergency Use Authorization (EUA). This EUA will remain in effect (meaning this test can be used) for the duration of the COVID-19 declaration under Section 564(b)(1) of the Act, 21 U.S.C. section 360bbb-3(b)(1), unless the authorization is terminated or revoked.  Performed at [facility], [address], [place], [place] [address]   MRSA Next Gen by PCR, Nasal     Status: Abnormal   Collection Time: 09/19/23  7:40 AM   Specimen: Nasal Mucosa; Nasal Swab  Result Value Ref Range Status   MRSA by PCR Next Gen DETECTED (A) NOT DETECTED Final    Comment: RESULT CALLED TO, READ BACK BY AND VERIFIED WITH: G ROOS AT 1550 09/19/23 BY A WILSON (NOTE) The GeneXpert MRSA Assay (FDA approved for NASAL specimens only), is one component of a comprehensive MRSA colonization surveillance program. It is not intended to diagnose MRSA infection nor to guide or monitor treatment for MRSA infections. Test performance is not FDA approved in patients less than 36 years old. Performed at [facility], [address] [place], [place] [address]      Labs: BNP (last 3 results) No results for input(s): "BNP" in the last 8760 hours. Basic Metabolic Panel: Recent Labs  Lab 09/24/23  2142 09/25/23 0638 09/26/23 0409 09/27/23 0439  NA 143 137 135 134*  K 4.1 3.7 3.9 3.6  CL 106 104 97* 97*  CO2 23 23 27 27   GLUCOSE 113* 88 92 103*  BUN 5* 5* 6 5*  CREATININE 0.58* 0.42* 0.48* 0.54*  CALCIUM 9.4 8.9 9.6 9.4  MG 2.1 1.8 2.2 2.0  PHOS  --   --  4.7*  --    Liver Function Tests: Recent Labs  Lab 09/24/23 2142 09/25/23 0638 09/26/23 0409 09/27/23 0439  AST 225* 156* 93* 90*  ALT 176* 134* 108* 96*  ALKPHOS 107 85 99 93  BILITOT 0.4 0.4 1.0 0.4  PROT 8.0 6.7 7.1 7.1  ALBUMIN 4.8 3.9 4.1 4.0   No results for input(s): "LIPASE", "AMYLASE" in the last 168 hours. No results for input(s): "AMMONIA" in the last 168 hours. CBC: Recent Labs  Lab  09/24/23 2142 09/25/23 0638 09/26/23 0409 09/27/23 0439  WBC 5.3 4.1 3.4* 3.3*  NEUTROABS  --   --  1.5*  --   HGB 10.9* 9.7* 11.5* 10.7*  HCT 33.3* 29.2* 33.5* 32.8*  MCV 104.7* 103.9* 101.2* 104.5*  PLT 205 164 199 187   Cardiac Enzymes: No results for input(s): "CKTOTAL", "CKMB", "CKMBINDEX", "TROPONINI" in the last 168 hours. BNP: Invalid input(s): "POCBNP" CBG: Recent Labs  Lab 09/20/23 1954 09/21/23 0733  GLUCAP 143* 120*   D-Dimer No results for input(s): "DDIMER" in the last 72 hours. Hgb A1c No results for input(s): "HGBA1C" in the last 72 hours. Lipid Profile No results for input(s): "CHOL", "HDL", "LDLCALC", "TRIG", "CHOLHDL", "LDLDIRECT" in the last 72 hours. Thyroid function studies Recent Labs    09/26/23 0409  TSH 28.709*   Anemia work up Recent Labs    09/25/23 0638  VITAMINB12 232  FOLATE 7.6   Urinalysis    Component Value Date/Time   COLORURINE YELLOW 09/19/2023 0032   APPEARANCEUR HAZY (A) 09/19/2023 0032   LABSPEC 1.011 09/19/2023 0032   PHURINE 5.0 09/19/2023 0032   GLUCOSEU NEGATIVE 09/19/2023 0032   HGBUR NEGATIVE 09/19/2023 0032   BILIRUBINUR NEGATIVE 09/19/2023 0032   KETONESUR 20 (A) 09/19/2023 0032   PROTEINUR 100 (A) 09/19/2023 0032   UROBILINOGEN 0.2 03/20/2015 0340   NITRITE NEGATIVE 09/19/2023 0032   LEUKOCYTESUR NEGATIVE 09/19/2023 0032   Sepsis Labs Recent Labs  Lab 09/24/23 2142 09/25/23 0638 09/26/23 0409 09/27/23 0439  WBC 5.3 4.1 3.4* 3.3*   Microbiology Recent Results (from the past 240 hours)  Resp panel by RT-PCR (RSV, Flu A&B, Covid) Anterior Nasal Swab     Status: None   Collection Time: 09/18/23 10:37 PM   Specimen: Anterior Nasal Swab  Result Value Ref Range Status   SARS Coronavirus 2 by RT PCR NEGATIVE NEGATIVE Final    Comment: (NOTE) SARS-CoV-2 target nucleic acids are NOT DETECTED.  The SARS-CoV-2 RNA is generally detectable in upper respiratory specimens during the acute phase of  infection. The lowest concentration of SARS-CoV-2 viral copies this assay can detect is 138 copies/mL. A negative result does not preclude SARS-Cov-2 infection and should not be used as the sole basis for treatment or other patient management decisions. A negative result may occur with  improper specimen collection/handling, submission of specimen other than nasopharyngeal swab, presence of viral mutation(s) within the areas targeted by this assay, and inadequate number of viral copies(<138 copies/mL). A negative result must be combined with clinical observations, patient history, and epidemiological information. The expected result is Negative.  Fact  Sheet for Patients:  BloggerCourse.com  Fact Sheet for Healthcare Providers:  SeriousBroker.it  This test is no t yet approved or cleared by the [place]  FDA and  has been authorized for detection and/or diagnosis of SARS-CoV-2 by FDA under an Emergency Use Authorization (EUA). This EUA will remain  in effect (meaning this test can be used) for the duration of the COVID-19 declaration under Section 564(b)(1) of the Act, 21 U.S.C.section 360bbb-3(b)(1), unless the authorization is terminated  or revoked sooner.       Influenza A by PCR NEGATIVE NEGATIVE Final   Influenza B by PCR NEGATIVE NEGATIVE Final    Comment: (NOTE) The Xpert Xpress SARS-CoV-2/FLU/RSV plus assay is intended as an aid in the diagnosis of influenza from Nasopharyngeal swab specimens and should not be used as a sole basis for treatment. Nasal washings and aspirates are unacceptable for Xpert Xpress SARS-CoV-2/FLU/RSV testing.  Fact Sheet for Patients: BloggerCourse.com  Fact Sheet for Healthcare Providers: SeriousBroker.it  This test is not yet approved or cleared by the [place]  FDA and has been authorized for detection and/or diagnosis of SARS-CoV-2  by FDA under an Emergency Use Authorization (EUA). This EUA will remain in effect (meaning this test can be used) for the duration of the COVID-19 declaration under Section 564(b)(1) of the Act, 21 U.S.C. section 360bbb-3(b)(1), unless the authorization is terminated or revoked.     Resp Syncytial Virus by PCR NEGATIVE NEGATIVE Final    Comment: (NOTE) Fact Sheet for Patients: BloggerCourse.com  Fact Sheet for Healthcare Providers: SeriousBroker.it  This test is not yet approved or cleared by the [place]  FDA and has been authorized for detection and/or diagnosis of SARS-CoV-2 by FDA under an Emergency Use Authorization (EUA). This EUA will remain in effect (meaning this test can be used) for the duration of the COVID-19 declaration under Section 564(b)(1) of the Act, 21 U.S.C. section 360bbb-3(b)(1), unless the authorization is terminated or revoked.  Performed at [facility], [address], [place], [place] [address]   MRSA Next Gen by PCR, Nasal     Status: Abnormal   Collection Time: 09/19/23  7:40 AM   Specimen: Nasal Mucosa; Nasal Swab  Result Value Ref Range Status   MRSA by PCR Next Gen DETECTED (A) NOT DETECTED Final    Comment: RESULT CALLED TO, READ BACK BY AND VERIFIED WITH: G ROOS AT 1550 09/19/23 BY A WILSON (NOTE) The GeneXpert MRSA Assay (FDA approved for NASAL specimens only), is one component of a comprehensive MRSA colonization surveillance program. It is not intended to diagnose MRSA infection nor to guide or monitor treatment for MRSA infections. Test performance is not FDA approved in patients less than 26 years old. Performed at [facility], [address] [place], [place] [address]      Time coordinating discharge: 35 minutes  SIGNED:   Cornelius Dill, DO [facility] 09/27/2023, 4:55 PM  If 7PM-7AM, please contact night-coverage [email or web address]

## 2023-09-27 NOTE — Consult Note (Signed)
 [facility] Psychiatric Consult Initial  Patient Name: .[name]  MRN: [id number]  DOB: Apr 20, 1991  Consult Order details:  Orders (From admission, onward)     Start     Ordered   09/27/23 0952  CONSULT TO CALL ACT TEAM       Ordering Provider: Cornelius Dill, DO  Provider:  (Not yet assigned)  Question:  Reason for Consult?  Answer:  SI, pt is medically cleared,   09/27/23 0952   09/26/23 0804  CONSULT TO CALL ACT TEAM       Ordering Provider: Cornelius Dill, DO  Provider:  (Not yet assigned)  Question:  Reason for Consult?  Answer:  suicidal ideation   09/26/23 0803   09/25/23 0050  CONSULT TO CALL ACT TEAM       Ordering Provider: Adefeso, Oladapo, DO  Provider:  (Not yet assigned)  Question:  Reason for Consult?  Answer:  Psych consult   09/25/23 0050             Mode of Visit: Tele-visit Virtual Statement:TELE PSYCHIATRY ATTESTATION & CONSENT As the provider for this telehealth consult, I attest that I verified the patient's identity using two separate identifiers, introduced myself to the patient, provided my credentials, disclosed my location, and performed this encounter via a HIPAA-compliant, real-time, face-to-face, two-way, interactive audio and video platform and with the full consent and agreement of the patient (or guardian as applicable.) Patient physical location: [facility]. Telehealth provider physical location: home office in state of [place] .   Video start time: 15:01 Video end time: 15:40    Psychiatry Consult Evaluation  Service Date: September 27, 2023 LOS:  LOS: 2 days  Chief Complaint "I'm all good"  Primary Psychiatric Diagnoses  Alcohol  Intoxication   Assessment  [name] is a 33 y.o. male admitted: Presented to the [facility] 09/24/2023  9:07 PM brought in by EMS from home post fall while intoxicated. He carries the psychiatric diagnoses of anxiety disorder, alcohol  abuse and substance induced mood disorder and has a past  medical history of  hypothyroidism, AKI, hodgkin lymphoma and seizure with withdrawal.   His current presentation of positive outlook is most consistent with a patient that has completed detoxification from alcohol . He meets criteria for outpatient follow up based on not being a danger to himself or others.  Current psychotropic medications include librium  and hydroxyzine  and he has had a positive response to these medications. On initial examination, patient is cooperative and positive. Please see plan below for detailed recommendations.   Diagnoses:  Active Hospital problems: Principal Problem:   Alcohol  intoxication (HCC) Active Problems:   Transaminitis   Macrocytic anemia   Acquired hypothyroidism   Alcohol -induced mood disorder (HCC)    Plan   ## Psychiatric Medication Recommendations:  Continue current medications  ## Medical Decision Making Capacity: Not specifically addressed in this encounter  ## Further Work-up:   -- most recent EKG on 09/19/2023 had QtC of 470 -- Pertinent labwork reviewed earlier this admission includes: CBC, CMP, folate, b12, TSH, alcohol  and UDS   ## Disposition:-- There are no psychiatric contraindications to discharge at this time  ## Behavioral / Environmental: - No specific recommendations at this time.     ## Safety and Observation Level:  - Based on my clinical evaluation, I estimate the patient to be at low risk of self harm in the current setting. - At this time, we recommend  routine. This decision is based on  my review of the chart including patient's history and current presentation, interview of the patient, mental status examination, and consideration of suicide risk including evaluating suicidal ideation, plan, intent, suicidal or self-harm behaviors, risk factors, and protective factors. This judgment is based on our ability to directly address suicide risk, implement suicide prevention strategies, and develop a safety plan while the  patient is in the clinical setting. Please contact our team if there is a concern that risk level has changed.  CSSR Risk Category:C-SSRS RISK CATEGORY: No Risk  Suicide Risk Assessment: Patient has following modifiable risk factors for suicide: grief, which we are addressing by recommending outpatient therapy. Patient has following non-modifiable or demographic risk factors for suicide: male gender Patient has the following protective factors against suicide: Access to outpatient mental health care and Supportive family  Thank you for this consult request. Recommendations have been communicated to the primary team.  We will sign off at this time.   Jeraline Moment, NP       History of Present Illness  Relevant Aspects of Hospital ED Course:  Admitted on 09/24/2023 for brought in by EMS from home post fall while intoxicated. He carries the psychiatric diagnoses of anxiety disorder, alcohol  abuse and substance induced mood disorder and has a past medical history of  hypothyroidism, AKI, hodgkin lymphoma and seizure with withdrawal.   His current presentation of positive outlook is most consistent with a patient that has completed detoxification from alcohol . He meets criteria for outpatient followup based on not being a danger to himself or others.  Current psychotropic medications include librium  and hydroxyzine  and he has had a positive response to these medications. On initial examination, patient is cooperative and positive. Please see plan below for detailed recommendations.    Patient Report:  [name], is seen face to face by this provider, consulted with Dr. Deborah Falling; and chart reviewed on 09/27/23.  On evaluation GWENDOLYN NISHI reports "I'm all good".  He says he was drinking and that led to his hospitalization. Patient says he has learned that alcohol  causes his seizures.  He says that he is grieving his father who died 2 years ago.  He says he has never wanted to hurt himself and  does not want to hurt himself now.  He feels an obligation to be well and to be a support for his mother who he lives with.      During evaluation TAYLOR LEVICK is seated in bed in no acute distress.  He is alert & oriented x 4, calm, cooperative and attentive for this assessment.  His mood is euthymic with congruent affect.  He has normal speech, and behavior.  Objectively there is no evidence of psychosis/mania or delusional thinking. Pt does not appear to be responding to internal or external stimuli.  Patient is able to converse coherently, goal directed thoughts, no distractibility, or pre-occupation.  He denies suicidal/self-harm/homicidal ideation, psychosis, and paranoia.  Patient answered questions appropriately.    Psych ROS:  Depression: endorses Anxiety:  endorses Mania (lifetime and current): denies  Psychosis: (lifetime and current): denies  Review of Systems  Psychiatric/Behavioral:  Positive for substance abuse.   All other systems reviewed and are negative.    Psychiatric and Social History  Psychiatric History:  Information collected from patient and chart review  Prev Dx/Sx: anxiety disorder, alcohol  abuse and substance induced mood disorder Current Psych Provider: none Home Meds (current): none Previous Med Trials: unknown Therapy: previous 2 visits; patient states  he plans to engage a therapist for treatment  Prior Psych Hospitalization: denies  Prior Self Harm: denies Prior Violence: denies  Family Psych History: denies Family Hx suicide: denies  Social History:  Developmental Hx: WNL Educational Hx: completed 11th grade and GED Occupational Hx: unemployed Armed forces operational officer Hx: none noted Living Situation: lives with Mom Spiritual Hx: none noted Access to weapons/lethal means: denies   Substance History Alcohol : long history of abuse  History of alcohol  withdrawal seizures yes  Exam Findings  Physical Exam:  Vital Signs:  Temp:  [98.2 F (36.8 C)-99.6 F  (37.6 C)] 98.2 F (36.8 C) (04/18 1424) Pulse Rate:  [88-105] 105 (04/18 1424) Resp:  [16-20] 17 (04/18 1424) BP: (121-141)/(92-95) 121/95 (04/18 1424) SpO2:  [98 %-100 %] 98 % (04/18 0347) Blood pressure (!) 121/95, pulse (!) 105, temperature 98.2 F (36.8 C), resp. rate 17, height 5\' 6"  (1.676 m), weight 56.6 kg, SpO2 98%. Body mass index is 20.14 kg/m.  Physical Exam Vitals and nursing note reviewed.  Eyes:     Pupils: Pupils are equal, round, and reactive to light.  Pulmonary:     Effort: Pulmonary effort is normal.  Skin:    General: Skin is dry.  Neurological:     Mental Status: He is alert and oriented to person, place, and time.  Psychiatric:        Attention and Perception: Attention and perception normal.        Mood and Affect: Mood and affect normal.        Speech: Speech normal.        Behavior: Behavior normal. Behavior is cooperative.        Thought Content: Thought content normal.        Cognition and Memory: Cognition and memory normal.     Mental Status Exam: General Appearance: Casual  Orientation:  Full (Time, Place, and Person)  Memory:  Immediate;   Good Recent;   Good Remote;   Good  Concentration:  Concentration: Good  Recall:  Good  Attention  Good  Eye Contact:  Good  Speech:  Clear and Coherent  Language:  Good  Volume:  Normal  Mood: euthymic  Affect:  Congruent  Thought Process:  Coherent  Thought Content:  WDL  Suicidal Thoughts:  No  Homicidal Thoughts:  No  Judgement:  Impaired  Insight:  Shallow  Psychomotor Activity:  Normal  Akathisia:  No  Fund of Knowledge:  Good      Assets:  Communication Skills Desire for Improvement Housing Social Support  Cognition:  WNL  ADL's:  Intact  AIMS (if indicated):        Other History   These have been pulled in through the EMR, reviewed, and updated if appropriate.  Family History:  The patient's family history is not on file.  Medical History: Past Medical History:   Diagnosis Date   Alcohol  abuse    Depression    Lymphoma (HCC)    Thyroid disease     Surgical History: Past Surgical History:  Procedure Laterality Date   APPENDECTOMY       Medications:   Current Facility-Administered Medications:    chlordiazePOXIDE  (LIBRIUM ) capsule 25 mg, 25 mg, Oral, Q6H PRN, Sheikh, Omair Latif, DO   [COMPLETED] chlordiazePOXIDE  (LIBRIUM ) capsule 25 mg, 25 mg, Oral, QID, 25 mg at 09/26/23 2153 **FOLLOWED BY** chlordiazePOXIDE  (LIBRIUM ) capsule 25 mg, 25 mg, Oral, TID, 25 mg at 09/27/23 0848 **FOLLOWED BY** [START ON 09/28/2023] chlordiazePOXIDE  (LIBRIUM ) capsule 25 mg, 25  mg, Oral, BH-qamhs **FOLLOWED BY** [START ON 09/29/2023] chlordiazePOXIDE  (LIBRIUM ) capsule 25 mg, 25 mg, Oral, Daily, Sheikh, Omair Latif, DO   enoxaparin  (LOVENOX ) injection 40 mg, 40 mg, Subcutaneous, Q24H, Adefeso, Oladapo, DO, 40 mg at 09/27/23 0847   hydrALAZINE  (APRESOLINE ) injection 5 mg, 5 mg, Intravenous, Q6H PRN, Gillermo Lack, Omair Latif, DO, 5 mg at 09/25/23 8295   hydrOXYzine  (ATARAX ) tablet 25 mg, 25 mg, Oral, Q6H PRN, Sheikh, Omair Latif, DO, 25 mg at 09/27/23 6213   levothyroxine  (SYNTHROID ) tablet 50 mcg, 50 mcg, Oral, Q0600, Mason Sole, Pratik D, DO, 50 mcg at 09/27/23 0846   loperamide  (IMODIUM ) capsule 2-4 mg, 2-4 mg, Oral, PRN, Sheikh, Omair Latif, DO, 4 mg at 09/27/23 0865   LORazepam  (ATIVAN ) injection 0-4 mg, 0-4 mg, Intravenous, Q12H, 1 mg at 09/25/23 2123 **OR** LORazepam  (ATIVAN ) tablet 0-4 mg, 0-4 mg, Oral, Q12H, Nazari, Walid A, RPH   multivitamin with minerals tablet 1 tablet, 1 tablet, Oral, Daily, Aura Leeds [place], DO, 1 tablet at 09/27/23 7846   ondansetron  (ZOFRAN ) tablet 4 mg, 4 mg, Oral, Q6H PRN **OR** ondansetron  (ZOFRAN ) injection 4 mg, 4 mg, Intravenous, Q6H PRN, Adefeso, Oladapo, DO   thiamine  (VITAMIN B1) tablet 100 mg, 100 mg, Oral, Daily, 100 mg at 09/27/23 0846 **OR** [DISCONTINUED] thiamine  (VITAMIN B1) injection 100 mg, 100 mg, Intravenous, Daily, Adefeso,  Oladapo, DO  Allergies: Allergies  Allergen Reactions   Morphine And Codeine Itching    Jeraline Moment, NP

## 2023-09-27 NOTE — TOC Progression Note (Signed)
 Transition of Care [facility]) - Progression Note    Patient Details  Name: [name] MRN: [id number] Date of Birth: January 19, 1991  [facility] CM/SW Contact  Orelia Binet, RN Phone Number: 09/27/2023, 10:51 AM  Clinical Narrative:    AC confirming we need TTS consult today, RN has cart set up in the room ready for assessment. TOC following.     Expected Discharge Plan: Home/Self Care Barriers to Discharge: Continued Medical Work up  Expected Discharge Plan and Services                                               Social Determinants of Health (SDOH) Interventions SDOH Screenings   Food Insecurity: No Food Insecurity (09/25/2023)  Housing: Low Risk  (09/25/2023)  Transportation Needs: No Transportation Needs (09/25/2023)  Utilities: Not At Risk (09/25/2023)  Tobacco Use: High Risk (09/24/2023)    Readmission Risk Interventions    09/25/2023    8:14 AM 09/19/2023    9:19 AM  Readmission Risk Prevention Plan  Medication Screening  Complete  Transportation Screening Complete Complete  Home Care Screening Complete   Medication Review (RN CM) Complete

## 2023-09-27 NOTE — Plan of Care (Signed)

## 2023-09-27 NOTE — Plan of Care (Signed)
